# Patient Record
Sex: Male | Born: 1965 | Race: White | Hispanic: No | Smoking: Never smoker
Health system: Southern US, Community
[De-identification: ages and names within clinical notes are randomized; demographics above are authoritative.]

## PROBLEM LIST (undated history)

## (undated) DIAGNOSIS — T7840XA Allergy, unspecified, initial encounter: Secondary | ICD-10-CM

## (undated) DIAGNOSIS — K219 Gastro-esophageal reflux disease without esophagitis: Secondary | ICD-10-CM

## (undated) DIAGNOSIS — K4090 Unilateral inguinal hernia, without obstruction or gangrene, not specified as recurrent: Secondary | ICD-10-CM

## (undated) DIAGNOSIS — K4020 Bilateral inguinal hernia, without obstruction or gangrene, not specified as recurrent: Secondary | ICD-10-CM

## (undated) DIAGNOSIS — M1990 Unspecified osteoarthritis, unspecified site: Secondary | ICD-10-CM

## (undated) DIAGNOSIS — R935 Abnormal findings on diagnostic imaging of other abdominal regions, including retroperitoneum: Secondary | ICD-10-CM

## (undated) DIAGNOSIS — K429 Umbilical hernia without obstruction or gangrene: Secondary | ICD-10-CM

## (undated) DIAGNOSIS — J3089 Other allergic rhinitis: Secondary | ICD-10-CM

## (undated) DIAGNOSIS — Z8616 Personal history of COVID-19: Secondary | ICD-10-CM

## (undated) HISTORY — DX: Umbilical hernia without obstruction or gangrene: K42.9

## (undated) HISTORY — DX: Allergy, unspecified, initial encounter: T78.40XA

## (undated) HISTORY — PX: APPENDECTOMY: SHX54

## (undated) HISTORY — DX: Abnormal findings on diagnostic imaging of other abdominal regions, including retroperitoneum: R93.5

## (undated) HISTORY — DX: Unspecified osteoarthritis, unspecified site: M19.90

## (undated) HISTORY — PX: DENTAL SURGERY: SHX609

## (undated) HISTORY — DX: Unilateral inguinal hernia, without obstruction or gangrene, not specified as recurrent: K40.90

---

## 1985-03-06 HISTORY — PX: APPENDECTOMY: SHX54

## 2015-12-10 ENCOUNTER — Ambulatory Visit (INDEPENDENT_AMBULATORY_CARE_PROVIDER_SITE_OTHER)
Admission: RE | Admit: 2015-12-10 | Discharge: 2015-12-10 | Disposition: A | Discharge status: Home / Self Care | Payer: Self-pay | Source: Ambulatory Visit | Attending: Family Medicine | Admitting: Family Medicine

## 2015-12-10 ENCOUNTER — Encounter: Payer: Self-pay | Admitting: Family Medicine

## 2015-12-10 ENCOUNTER — Encounter: Payer: Self-pay | Admitting: Gastroenterology

## 2015-12-10 ENCOUNTER — Ambulatory Visit (INDEPENDENT_AMBULATORY_CARE_PROVIDER_SITE_OTHER): Payer: Self-pay | Admitting: Family Medicine

## 2015-12-10 VITALS — BP 118/80 | HR 74 | Temp 98.3°F | Resp 12 | Ht 66.0 in | Wt 183.2 lb

## 2015-12-10 DIAGNOSIS — M7541 Impingement syndrome of right shoulder: Secondary | ICD-10-CM

## 2015-12-10 DIAGNOSIS — S4991XA Unspecified injury of right shoulder and upper arm, initial encounter: Secondary | ICD-10-CM

## 2015-12-10 DIAGNOSIS — R200 Anesthesia of skin: Secondary | ICD-10-CM

## 2015-12-10 DIAGNOSIS — R202 Paresthesia of skin: Secondary | ICD-10-CM | POA: Diagnosis not present

## 2015-12-10 DIAGNOSIS — Z1211 Encounter for screening for malignant neoplasm of colon: Secondary | ICD-10-CM | POA: Diagnosis not present

## 2015-12-10 MED ORDER — TRAMADOL HCL 50 MG PO TABS: 50.0000 mg | ORAL_TABLET | Freq: Two times a day (BID) | ORAL | 0 refills | Status: AC | PRN

## 2015-12-10 MED ORDER — MELOXICAM 15 MG PO TABS: 15.0000 mg | ORAL_TABLET | Freq: Every day | ORAL | 0 refills | Status: DC

## 2015-12-10 MED ORDER — METHYLPREDNISOLONE ACETATE 80 MG/ML IJ SUSP
40.0000 mg | Freq: Once | INTRAMUSCULAR | Status: AC
  Administered 2015-12-10: 40 mg via INTRAMUSCULAR

## 2015-12-10 NOTE — Progress Notes (Signed)
HPI:   [name] is a 50 y.o. male, who is here today to establish care with me.  Former PCP: N/A Last preventive routine visit: over 10 years ago. He has never had a colonoscopy, he would like to schedule one.  In general he is healthy except for history of seasonal allergies, takes no chronic medications.  He states that he follows a healthy diet, he does not exercise regularly but he has a physical job. He lives with his girlfriend.  Concerns today: Right shoulder pain. Today's is complaining of 8 days of constant right shoulder pain, points anterior and posterior aspect of the shoulder. Pain started suddenly after he was doing overhead lifting, he felt a "pop" and pain developed right after. Initially throbbing pain now more achy pain, 7/10, exacerbated by laying on left side in bed and with activities that involve lifting and overhead activities. Alleviated by hanging right UE on the edge of bed and with rest. Mild limitation of ROM. He has not noted any joint erythema, erythema, or ecchymosis. He denies any prior history of arthralgia.  He is also having mild numbness, intermittently, and medial aspect of proximal right upper extremity. He denies any cervical pain, rash, or weakness.  Right handed.  He has been taking OTC NSAIDs and Tylenol but they do not seem to help now.     Review of Systems  Constitutional: Negative for appetite change, fatigue, fever and unexpected weight change.  HENT: Negative for nosebleeds, sore throat and trouble swallowing.   Eyes: Negative for pain, redness and visual disturbance.  Respiratory: Negative for apnea, cough, shortness of breath and wheezing.   Cardiovascular: Negative for chest pain, palpitations and leg swelling.  Gastrointestinal: Negative for abdominal pain, blood in stool, nausea and vomiting.  Genitourinary: Negative for decreased urine volume, difficulty urinating and hematuria.  Musculoskeletal: Positive  for arthralgias. Negative for back pain, joint swelling, neck pain and neck stiffness.  Skin: Negative for color change and rash.  Allergic/Immunologic: Positive for environmental allergies.  Neurological: Positive for numbness. Negative for seizures, syncope, weakness and headaches.  Psychiatric/Behavioral: Positive for sleep disturbance (due to pain). Negative for confusion. The patient is not nervous/anxious.       No current outpatient prescriptions on file prior to visit.   No current facility-administered medications on file prior to visit.      Past Medical History:  Diagnosis Date  . Allergy   . Umbilical hernia    Allergies  Allergen Reactions  . Codeine     Family History  Problem Relation Age of Onset  . Heart disease Father   . Stroke Paternal Grandmother   . Cancer Paternal Grandmother     colon    Social History   Social History  . Marital status: Divorced    Spouse name: N/A  . Number of children: N/A  . Years of education: N/A   Social History Main Topics  . Smoking status: Never Smoker  . Smokeless tobacco: Never Used  . Alcohol use No  . Drug use: No  . Sexual activity: Yes   Other Topics Concern  . None   Social History Narrative  . None    Vitals:   12/10/15 0800  BP: 118/80  Pulse: 74  Resp: 12  Temp: 98.3 F (36.8 C)    Body mass index is 29.58 kg/m.   Physical Exam  Nursing note and vitals reviewed. Constitutional: He is oriented to person, place, and time.  He appears well-developed. No distress.  HENT:  Head: Atraumatic.  Mouth/Throat: Oropharynx is clear and moist and mucous membranes are normal.  Eyes: Conjunctivae and EOM are normal. Pupils are equal, round, and reactive to light.  Cardiovascular: Normal rate and regular rhythm.   No murmur heard. Respiratory: Effort normal and breath sounds normal. No respiratory distress.  GI: Soft. He exhibits no mass. There is no hepatomegaly. There is no tenderness.    Musculoskeletal: He exhibits no edema.  Shoulder: No deformity, edema, or erythema appreciated.No muscle atrophy. Luan Pulling' test+, drop arm rotator cuff test+, empty can supraspinatus test +, cross body adduction test +, lift-Off Subscapularis test +. ROM mildly limited (active), passive abduction.  Lymphadenopathy:    He has no cervical adenopathy.  Neurological: He is alert and oriented to person, place, and time. He has normal strength.  Skin: Skin is warm. No erythema.  Psychiatric: He has a normal mood and affect.  Well groomed, good eye contact.      ASSESSMENT AND PLAN:     Peter Archer was seen today for new patient (initial visit).  Diagnoses and all orders for this visit:  Right shoulder injury, initial encounter  ? Sprain, partial tear (rotator cuff,labrum, etc), OA among some. X ray to be done today. Symptomatic treatment recommended. Side effects of Tramadol discussed, he has taken it before and well tolerated. F/U as needed.   -     DG Shoulder Right; Future -     meloxicam (MOBIC) 15 MG tablet; Take 1 tablet (15 mg total) by mouth daily. -     traMADol (ULTRAM) 50 MG tablet; Take 1 tablet (50 mg total) by mouth 2 (two) times daily as needed.  Impingement syndrome, shoulder, right  We discussed treatment options including sub acromial steroid injection. If imaging is negative, PT will be arranged. Avoid overhead repetitive activities for now. ROM exercises for now.  -     meloxicam (MOBIC) 15 MG tablet; Take 1 tablet (15 mg total) by mouth daily. -     traMADol (ULTRAM) 50 MG tablet; Take 1 tablet (50 mg total) by mouth 2 (two) times daily as needed. -     methylPREDNISolone acetate (DEPO-MEDROL) injection 40 mg; Inject 0.5 mLs (40 mg total) into the muscle once.  Right upper extremity numbness  Could be related to trauma, no neurologic abnormalities appreciated today. We discussed some side effects of steroids but may help with numbness and pain, he agrees  with trying. No further studies today but it may be necessary to re-evaluate and consider work up if not resolved in a few weeks or if it gets worse.  -     methylPREDNISolone acetate (DEPO-MEDROL) injection 40 mg; Inject 0.5 mLs (40 mg total) into the muscle once.  Colon cancer screening -     Ambulatory referral to Gastroenterology       -CPE will be arranged.    Peter G. Martinique, MD  [facility]. [place] office.

## 2015-12-10 NOTE — Patient Instructions (Addendum)
A few things to remember from today's visit:   Right shoulder injury, initial encounter - Plan: DG Shoulder Right, meloxicam (MOBIC) 15 MG tablet, traMADol (ULTRAM) 50 MG tablet, methylPREDNISolone acetate (DEPO-MEDROL) injection 40 mg  Impingement syndrome, shoulder, right - Plan: meloxicam (MOBIC) 15 MG tablet, traMADol (ULTRAM) 50 MG tablet  Right upper extremity numbness  Colon cancer screening - Plan: Ambulatory referral to Gastroenterology  Numbness Is related to her local injury, medication given today might help, monitor for worsening symptom.  Today x-ray will be done and if no fracture or major abnormality appointment with physical therapy will be arranged.  Local heat and cold. Avoid repetitive overhead activities for now.    Please be sure medication list is accurate. If a new problem present, please set up appointment for physical and fasting labs.

## 2015-12-20 ENCOUNTER — Ambulatory Visit: Payer: PRIVATE HEALTH INSURANCE | Attending: Family Medicine

## 2015-12-20 DIAGNOSIS — R293 Abnormal posture: Secondary | ICD-10-CM

## 2015-12-20 DIAGNOSIS — M6281 Muscle weakness (generalized): Secondary | ICD-10-CM | POA: Insufficient documentation

## 2015-12-20 DIAGNOSIS — M25511 Pain in right shoulder: Secondary | ICD-10-CM | POA: Diagnosis present

## 2015-12-20 NOTE — Patient Instructions (Addendum)
Posture - Standing   Good posture is important. Avoid slouching and forward head thrust. Maintain curve in low back and align ears over shoulders, hips over ankles.  Pull your belly button in toward your back bone. Posture Tips DO: - stand tall and erect - keep chin tucked in - keep head and shoulders in alignment - check posture regularly in mirror or large window - pull head back against headrest in car seat;  Change your position often.  Sit with lumbar support. DON'T: - slouch or slump while watching TV or reading - sit, stand or lie in one position  for too long;  Sitting is especially hard on the spine so if you sit at a desk/use the computer, then stand up often! Copyright  VHI. All rights reserved.  Posture - Sitting  Sit upright, head facing forward. Try using a roll to support lower back. Keep shoulders relaxed, and avoid rounded back. Keep hips level with knees. Avoid crossing legs for long periods. Copyright  VHI. All rights reserved.  Chronic neck strain can develop because of poor posture and faulty work habits  Postural strain related to slumped sitting and forward head posture is a leading cause of headaches, neck and upper back pain  General strengthening and flexibility exercises are helpful in the treatment of neck pain.  Most importantly, you should learn to correct the posture that may be contributing to chronic pain.   Change positions frequently  Change your work or home environment to improve posture and mechanics.   KEEP HEAD IN NEUTRAL AND SHOULDERS DOWN AND RELAXED   Hold tubing in right hand, arm forward. Pull arm back, elbow straight. Repeat __10__ times per set. Do __2__ sets per session. Do _1-2___ sessions per day.  Copyright  VHI. All rights reserved.     With resistive band anchored in door, grasp both ends. Keeping elbows bent, pull back, squeezing shoulder blades together. Hold _3__ seconds. Repeat _2x10___ times. Do _1-2___ sessions per  day.  [email or web address].exer.[email or web address]   [facility] [address] [place], [place] [address] Phone # [phone] Fax [phone]

## 2015-12-20 NOTE — Therapy (Signed)
[facility] [address] [place], [place], [address] Phone: [phone]   Fax:  [phone]  Physical Therapy Evaluation  Patient Details  Name: Peter Archer MRN: [id number] Date of Birth: 06-08-1965 Referring Provider: Martinique, Betty, MD  Encounter Date: 12/20/2015      PT End of Session - 12/20/15 0918    Visit Number 1   Date for PT Re-Evaluation 02/14/16   PT Start Time 0852   PT Stop Time 0925   PT Time Calculation (min) 33 min   Activity Tolerance Patient tolerated treatment well   Behavior During Therapy [facility] for tasks assessed/performed      Past Medical History:  Diagnosis Date  . Allergy   . Umbilical hernia     Past Surgical History:  Procedure Laterality Date  . APPENDECTOMY  1987    There were no vitals filed for this visit.       Subjective Assessment - 12/20/15 0857    Subjective Pt is a Rt hand dominant male who presents to PT with complaints of Rt shoulder pain that began 12/02/15 that began without cause.  Pt tried to raise the Rt arm and felt a pop followed by pain.     Diagnostic tests x-ray: negative   Patient Stated Goals reduce Rt shoulder pain, reach over head with Rt shoulder   Currently in Pain? Yes   Pain Score 7    Pain Location Shoulder   Pain Orientation Right   Pain Descriptors / Indicators Throbbing;Sore   Pain Type Acute pain   Pain Onset 1 to 4 weeks ago   Pain Frequency Intermittent   Aggravating Factors  raising arm overhead, use of Rt UE with work, in the evening after working, sleep   Pain Relieving Factors medicine, rest of Rt arm on pillow,    Effect of Pain on Daily Activities pain with work, pain with sleep            [facility] PT Assessment - 12/20/15 0001      Assessment   Medical Diagnosis Impingement syndrome, shoulder, right   Referring Provider Martinique, Betty, MD   Onset Date/Surgical Date 12/02/15   Hand Dominance Right   Next MD Visit  none   Prior Therapy none     Precautions   Precautions None     Restrictions   Weight Bearing Restrictions No     Balance Screen   Has the patient fallen in the past 6 months No   Has the patient had a decrease in activity level because of a fear of falling?  No   Is the patient reluctant to leave their home because of a fear of falling?  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  Pain on the  Rt with all AROM     PROM   Overall PROM  Within functional limits for tasks performed     Strength   Overall Strength Deficits   Overall Strength Comments Rt shoulder strength 4/5 throughout with pain upon testing.  Lt shoulder 5/5.     Palpation   Palpation comment pt with palplable tenderness over Rt medial scapular border and Rt rotator cuff                           PT Education - 12/20/15 0917    Education provided Yes   Education Details posture, scapular theraband attached   Person(s) Educated Patient   Methods Explanation;Demonstration;Handout   Comprehension Verbalized understanding;Returned demonstration          PT Short Term Goals - 12/20/15 0901      PT SHORT TERM GOAL #1   Title be independent in initial HEP   Time 4   Period Weeks   Status New     PT SHORT TERM GOAL #2   Title sleep with 25% less  Rt UE pain to improve quality and quantity of sleep   Time 4   Period Weeks   Status New     PT SHORT TERM GOAL #3   Title report a 30% reduction in Rt shoulder pain with work activity   Time 4   Period Weeks   Status New           PT Long Term Goals - 12/20/15 0854      PT LONG TERM GOAL #1   Title be inependent in advanced HEP   Time 8   Period Weeks   Status New     PT LONG TERM GOAL #2   Title reduce FOTO to < or = to 30% limitation   Time 8   Period Weeks   Status New     PT LONG TERM GOAL #3   Title sleep with 50% less Rt shoulder pain to improve quality and quantity of sleep   Time 8   Period Weeks   Status New     PT LONG TERM GOAL #4   Title report a 60% reduction in Rt shoulder pain with use at work   Time 8   Period Weeks   Status New     PT LONG TERM GOAL #5   Title verbalize and demonstrate neutral seated posture at least 50% of the time   Time 8   Period Weeks   Status New               Plan - 12/20/15 [id number]    Clinical Impression Statement Pt is a Rt hand dominant male who presents to PT with Rt shoulder pain that began 12/02/15 without injury.  Pt reached up and felt a pop followed by pain.  Pt had x-ray that was negative.  Pt demonstrates poor posture, painful Rt shoulder AROM, reduced Rt shoulder strength with resisted testing due to pain, and palpable tenderness at the Rt medial scapular border and rotator cuff.  Pt will benefit from skilled PT for posutral and shoulder strength progression, manual and modalities to reduce pain.     Rehab Potential Good   PT Frequency 2x / week   PT Duration 8 weeks   PT Treatment/Interventions ADLs/Self Care Home Management;Cryotherapy;Electrical Stimulation;Iontophoresis 4mg /ml Dexamethasone;Ultrasound;Moist Heat;Therapeutic activities;Therapeutic exercise;Neuromuscular re-education;Patient/family education;Passive range of motion;Manual techniques;Dry needling;Taping   PT Next Visit Plan ionto  if  order signed, Rt shoulder strength, AROM, postural strength   Consulted and Agree with Plan of Care Patient      Patient will benefit from skilled therapeutic intervention in order to improve the following deficits and impairments:  Pain, Postural dysfunction, Decreased strength, Improper body mechanics, Decreased activity tolerance, Impaired UE functional use  Visit Diagnosis: Acute pain of right shoulder - Plan: PT plan of care cert/re-cert  Abnormal posture - Plan: PT plan of care cert/re-cert  Muscle weakness (generalized) - Plan: PT plan of care cert/re-cert     Problem List There are no active problems to display for this patient.    [name], PT 12/20/15 9:29 AM  [facility] [address] [place], [place], [address] Phone: [phone]   Fax:  [phone]  Name: Aristeo Granade MRN: [id number] Date of Birth: 04-13-1965

## 2015-12-23 ENCOUNTER — Ambulatory Visit: Payer: PRIVATE HEALTH INSURANCE

## 2015-12-23 DIAGNOSIS — M25511 Pain in right shoulder: Secondary | ICD-10-CM | POA: Diagnosis not present

## 2015-12-23 DIAGNOSIS — M6281 Muscle weakness (generalized): Secondary | ICD-10-CM

## 2015-12-23 DIAGNOSIS — R293 Abnormal posture: Secondary | ICD-10-CM

## 2015-12-23 NOTE — Therapy (Signed)
[facility] [address], [place], [place], [address] Phone: [phone]   Fax:  [phone]  Physical Therapy Treatment  Patient Details  Name: Peter Archer MRN: [id number] Date of Birth: 1965/07/25 Referring Provider: Martinique, Betty, MD  Encounter Date: 12/23/2015      PT End of Session - 12/23/15 0806    Visit Number 2   Date for PT Re-Evaluation 02/14/16   PT Start Time 0740  10 min late   PT Stop Time 0806   PT Time Calculation (min) 26 min   Activity Tolerance Patient tolerated treatment well   Behavior During Therapy [facility] for tasks assessed/performed      Past Medical History:  Diagnosis Date  . Allergy   . Umbilical hernia     Past Surgical History:  Procedure Laterality Date  . APPENDECTOMY  1987    There were no vitals filed for this visit.      Subjective Assessment - 12/23/15 0741    Subjective Back of the shoulder is feeling better.  Had pain in the front of the Rt shoulder 2 nights ago that disrupted sleep.     Patient Stated Goals reduce Rt shoulder pain, reach over head with Rt shoulder   Currently in Pain? Yes   Pain Score 5    Pain Location Shoulder   Pain Orientation Right   Pain Descriptors / Indicators Throbbing;Sore   Pain Type Acute pain   Pain Onset 1 to 4 weeks ago   Pain Frequency Intermittent   Aggravating Factors  Raising arm overhead, use of Rt UE with work, in the eveneing after working, sleep   Pain Relieving Factors medicine, Rt arm on pillow                         [facility] Adult PT Treatment/Exercise - 12/23/15 0001      Exercises   Exercises Shoulder     Shoulder Exercises: Supine   Horizontal ABduction Strengthening;Both;20 reps;Theraband   Theraband Level (Shoulder Horizontal ABduction) Level 2 (Red)   External Rotation Strengthening;Both;20 reps;Theraband   Theraband Level (Shoulder External Rotation) Level 2 (Red)   Other Supine  Exercises D2 red band 2x10      Shoulder Exercises: Standing   Extension Strengthening;Both;20 reps;Theraband   Theraband Level (Shoulder Extension) Level 2 (Red)   Row Strengthening;Both;20 reps   Theraband Level (Shoulder Row) Level 2 (Red)     Shoulder Exercises: ROM/Strengthening   UBE (Upper Arm Bike) Level 1x 6 minutes (3/3)  verbal cues for posture, PT present to discuss     Modalities   Modalities Iontophoresis     Iontophoresis   Type of Iontophoresis Dexamethasone   Location Rt anterior shoulder   Dose 1.0cc  #1   Time 6 hour wear                PT Education - 12/23/15 0745    Education provided Yes   Education Details pec stretch, scapular theraband unattached in supine, ionto info   Person(s) Educated Patient   Methods Explanation;Demonstration   Comprehension Verbalized understanding;Returned demonstration          PT Short Term Goals - 12/20/15 0901      PT SHORT TERM GOAL #1   Title be independent in initial HEP   Time 4   Period Weeks   Status New     PT SHORT TERM GOAL #2   Title sleep with 25%  less Rt UE pain to improve quality and quantity of sleep   Time 4   Period Weeks   Status New     PT SHORT TERM GOAL #3   Title report a 30% reduction in Rt shoulder pain with work activity   Time 4   Period Weeks   Status New           PT Long Term Goals - 12/20/15 0854      PT LONG TERM GOAL #1   Title be inependent in advanced HEP   Time 8   Period Weeks   Status New     PT LONG TERM GOAL #2   Title reduce FOTO to < or = to 30% limitation   Time 8   Period Weeks   Status New     PT LONG TERM GOAL #3   Title sleep with 50% less Rt shoulder pain to improve quality and quantity of sleep   Time 8   Period Weeks   Status New     PT LONG TERM GOAL #4   Title report a 60% reduction in Rt shoulder pain with use at work   Time 8   Period Weeks   Status New     PT LONG TERM GOAL #5   Title verbalize and demonstrate neutral  seated posture at least 50% of the time   Time 8   Period Weeks   Status New               Plan - 12/23/15 0754    Clinical Impression Statement Pt has been making postural corrections at home and reports that he is more aware of his alignment.  PT is focusing on postural/scapular strength to reduce shoulder impingement.  Pain in Rt shoulder is reduced to 5/10 today.  Pt will continue to benefit from skilled PT for postural strength, manual/modalities and flexibility to reduce pain.     Rehab Potential Good   PT Frequency 2x / week   PT Duration 8 weeks   PT Treatment/Interventions ADLs/Self Care Home Management;Cryotherapy;Electrical Stimulation;Iontophoresis 4mg /ml Dexamethasone;Ultrasound;Moist Heat;Therapeutic activities;Therapeutic exercise;Neuromuscular re-education;Patient/family education;Passive range of motion;Manual techniques;Dry needling;Taping   PT Next Visit Plan ionto if order signed, Rt shoulder strength, AROM, postural strength.  Ionto #2      Patient will benefit from skilled therapeutic intervention in order to improve the following deficits and impairments:  Pain, Postural dysfunction, Decreased strength, Improper body mechanics, Decreased activity tolerance, Impaired UE functional use  Visit Diagnosis: Acute pain of right shoulder  Abnormal posture  Muscle weakness (generalized)     Problem List There are no active problems to display for this patient.  [name], PT 12/23/15 8:07 AM  [facility] [address] [place], [place], [address] Phone: [phone]   Fax:  [phone]  Name: Peter Archer MRN: [id number] Date of Birth: 12-03-1965

## 2015-12-23 NOTE — Patient Instructions (Addendum)
Flexibility: Corner Stretch    Standing in corner with hands just above shoulder level and feet ____ inches from corner, lean forward until a comfortable stretch is felt across chest. Hold _20___ seconds. Repeat _3___ times per set. Do _1___ sets per session. Do _3___ sessions per day.  http://orth.exer.us/342   Copyright  VHI. All rights reserved.    Do exercises below 10 times, 1-2 times a day.        Side Pull: Double Arm   On back, knees bent, feet flat. Arms perpendicular to body, shoulder level, elbows straight but relaxed. Pull arms out to sides, elbows straight. Resistance band comes across collarbones, hands toward floor. Hold momentarily. Slowly return to starting position. Repeat ___ times. Band color _____   Sash   On back, knees bent, feet flat, left hand on left hip, right hand above left. Pull right arm DIAGONALLY (hip to shoulder) across chest. Bring right arm along head toward floor. Hold momentarily. Slowly return to starting position. Repeat ___ times. Do with left arm. Band color ______   Shoulder Rotation: Double Arm   On back, knees bent, feet flat, elbows tucked at sides, bent 90, hands palms up. Pull hands apart and down toward floor, keeping elbows near sides. Hold momentarily. Slowly return to starting position. Repeat ___ times. Band color ______   IONTOPHORESIS PATIENT PRECAUTIONS & CONTRAINDICATIONS:  . Redness under one or both electrodes can occur.  This characterized by a uniform redness that usually disappears within 12 hours of treatment. . Small pinhead size blisters may result in response to the drug.  Contact your physician if the problem persists more than 24 hours. . On rare occasions, iontophoresis therapy can result in temporary skin reactions such as rash, inflammation, irritation or burns.  The skin reactions may be the result of individual sensitivity to the ionic solution used, the condition of the skin at the start of treatment,  reaction to the materials in the electrodes, allergies or sensitivity to dexamethasone, or a poor connection between the patch and your skin.  Discontinue using iontophoresis if you have any of these reactions and report to your therapist. . Remove the Patch or electrodes if you have any undue sensation of pain or burning during the treatment and report discomfort to your therapist. . Tell your Therapist if you have had known adverse reactions to the application of electrical current. . If using the Patch, the LED light will turn off when treatment is complete and the patch can be removed.  Approximate treatment time is 1-3 hours.  Remove the patch when light goes off or after 6 hours. . The Patch can be worn during normal activity, however excessive motion where the electrodes have been placed can cause poor contact between the skin and the electrode or uneven electrical current resulting in greater risk of skin irritation. Marland Kitchen Keep out of the reach of children.   . DO NOT use if you have a cardiac pacemaker or any other electrically sensitive implanted device. . DO NOT use if you have a known sensitivity to dexamethasone. . DO NOT use during Magnetic Resonance Imaging (MRI). . DO NOT use over broken or compromised skin (e.g. sunburn, cuts, or acne) due to the increased risk of skin reaction. . DO NOT SHAVE over the area to be treated:  To establish good contact between the Patch and the skin, excessive hair may be clipped. . DO NOT place the Patch or electrodes on or over your eyes, directly over  your heart, or brain. . DO NOT reuse the Patch or electrodes as this may cause burns to occur.   [facility] [address] [place], [place] [address] Phone # [phone] Fax [phone]

## 2015-12-27 ENCOUNTER — Ambulatory Visit: Payer: PRIVATE HEALTH INSURANCE

## 2015-12-27 DIAGNOSIS — M25511 Pain in right shoulder: Secondary | ICD-10-CM | POA: Diagnosis not present

## 2015-12-27 DIAGNOSIS — M6281 Muscle weakness (generalized): Secondary | ICD-10-CM

## 2015-12-27 DIAGNOSIS — R293 Abnormal posture: Secondary | ICD-10-CM

## 2015-12-27 NOTE — Therapy (Addendum)
[facility] [address] [place], [place], [address] Phone: [phone]   Fax:  [phone]  Physical Therapy Treatment  Patient Details  Name: Peter Archer MRN: [id number] Date of Birth: 1965/11/19 Referring Provider: Martinique, Betty, MD  Encounter Date: 12/27/2015      PT End of Session - 12/27/15 0924    Visit Number 3   Date for PT Re-Evaluation 02/14/16   PT Start Time 0845   PT Stop Time 0925   PT Time Calculation (min) 40 min   Activity Tolerance Patient tolerated treatment well   Behavior During Therapy [facility] for tasks assessed/performed      Past Medical History:  Diagnosis Date  . Allergy   . Umbilical hernia     Past Surgical History:  Procedure Laterality Date  . APPENDECTOMY  1987    There were no vitals filed for this visit.      Subjective Assessment - 12/27/15 0904    Subjective Feeling better since the start of care.  50% overall improvement.     Patient Stated Goals reduce Rt shoulder pain, reach over head with Rt shoulder   Currently in Pain? Yes   Pain Score 5    Pain Location Shoulder   Pain Orientation Right   Pain Descriptors / Indicators Throbbing;Sore   Pain Type Acute pain   Pain Onset 1 to 4 weeks ago   Pain Frequency Intermittent   Aggravating Factors  raising arm overhear, using Rt UE with work, sleep   Pain Relieving Factors medicine, Rt arm on pillow                         [facility] Adult PT Treatment/Exercise - 12/27/15 0001      Shoulder Exercises: Supine   Horizontal ABduction Strengthening;Both;20 reps;Theraband   Theraband Level (Shoulder Horizontal ABduction) Level 2 (Red)  on foam roll   External Rotation Strengthening;Both;20 reps;Theraband   Theraband Level (Shoulder External Rotation) Level 2 (Red)  on foam roll   Other Supine Exercises overhead flexion with bil arms 2x10  on foam roll     Shoulder Exercises: Standing   Other  Standing Exercises snow angels on wall (partial range) x20     Shoulder Exercises: ROM/Strengthening   UBE (Upper Arm Bike) Level 1x 6 minutes (3/3)  verbal cues for posture, PT present to discuss     Shoulder Exercises: Stretch   Corner Stretch 3 reps;20 seconds   Other Shoulder Stretches wall ladder: Rt shoulder flexion 2x10     Iontophoresis   Type of Iontophoresis Dexamethasone   Location Rt anterior shoulder   Dose 1.0cc  #2   Time 6 hour wear                  PT Short Term Goals - 12/27/15 0906      PT SHORT TERM GOAL #1   Title be independent in initial HEP   Status Achieved     PT SHORT TERM GOAL #2   Title sleep with 25% less Rt UE pain to improve quality and quantity of sleep   Status Achieved     PT SHORT TERM GOAL #3   Title report a 30% reduction in Rt shoulder pain with work activity   Status Achieved           PT Long Term Goals - 12/20/15 0854      PT LONG TERM GOAL #1   Title  be inependent in advanced HEP   Time 8   Period Weeks   Status New     PT LONG TERM GOAL #2   Title reduce FOTO to < or = to 30% limitation   Time 8   Period Weeks   Status New     PT LONG TERM GOAL #3   Title sleep with 50% less Rt shoulder pain to improve quality and quantity of sleep   Time 8   Period Weeks   Status New     PT LONG TERM GOAL #4   Title report a 60% reduction in Rt shoulder pain with use at work   Time 8   Period Weeks   Status New     PT LONG TERM GOAL #5   Title verbalize and demonstrate neutral seated posture at least 50% of the time   Time 8   Period Weeks   Status New               Plan - 12/27/15 0906    Clinical Impression Statement Pt reports 50% overall improvement since the start of care.  Pt reports that he is able to sleep without much interruption.  PT is focusing on postural/scapular strength to reduce shoulder impingement.  Pain remains 5/10 in the Rt shoulder but quality of movement is improved.  Pt will  continue to benefit from skilled PT for postural strength, manual/modalites and flexibility to reduce pain.     Rehab Potential Good   PT Frequency 2x / week   PT Duration 8 weeks   PT Treatment/Interventions ADLs/Self Care Home Management;Cryotherapy;Electrical Stimulation;Iontophoresis 31m/ml Dexamethasone;Ultrasound;Moist Heat;Therapeutic activities;Therapeutic exercise;Neuromuscular re-education;Patient/family education;Passive range of motion;Manual techniques;Dry needling;Taping   PT Next Visit Plan , Rt shoulder strength, AROM, postural strength.  Ionto #3   Consulted and Agree with Plan of Care Patient      Patient will benefit from skilled therapeutic intervention in order to improve the following deficits and impairments:  Pain, Postural dysfunction, Decreased strength, Improper body mechanics, Decreased activity tolerance, Impaired UE functional use  Visit Diagnosis: Acute pain of right shoulder  Abnormal posture  Muscle weakness (generalized)     Problem List There are no active problems to display for this patient.   Peter Archer 12/27/2015, 9:26 AM PHYSICAL THERAPY DISCHARGE SUMMARY  Visits from Start of Care: 3  Current functional level related to goals / functional outcomes: Pt didn't return to PT.  He asked to be placed on hold after last session on 12/27/15.     Remaining deficits: See above for most recent status.     Education / Equipment: HEP Plan: Patient agrees to discharge.  Patient goals were partially met. Patient is being discharged due to the patient's request.  ?????        KSigurd Archer PT 02/03/16 9:18 AM   [facility] [address] [place] [id number]: [phone]  Fax:  [phone] Name: Peter KlaiberMRN: [id number] of Birth: [birth year]

## 2015-12-30 ENCOUNTER — Encounter: Payer: PRIVATE HEALTH INSURANCE | Admitting: Physical Therapy

## 2015-12-30 ENCOUNTER — Other Ambulatory Visit: Payer: Self-pay

## 2015-12-30 DIAGNOSIS — S4991XA Unspecified injury of right shoulder and upper arm, initial encounter: Secondary | ICD-10-CM

## 2015-12-30 DIAGNOSIS — M7541 Impingement syndrome of right shoulder: Secondary | ICD-10-CM

## 2015-12-30 MED ORDER — MELOXICAM 15 MG PO TABS: 15.0000 mg | ORAL_TABLET | Freq: Every day | ORAL | 5 refills | Status: AC

## 2016-01-03 ENCOUNTER — Ambulatory Visit: Payer: PRIVATE HEALTH INSURANCE | Admitting: Physical Therapy

## 2016-01-06 ENCOUNTER — Encounter: Payer: PRIVATE HEALTH INSURANCE | Admitting: Physical Therapy

## 2016-01-13 ENCOUNTER — Telehealth: Payer: Self-pay | Admitting: Family Medicine

## 2016-01-13 NOTE — Telephone Encounter (Signed)
Noted  

## 2016-01-13 NOTE — Telephone Encounter (Signed)
Pt need new Rx for Tramadol   Pharm:  [facility]

## 2016-01-13 NOTE — Telephone Encounter (Signed)
Okay for refill?  Patient does have an appt set for January as asked for last visit.

## 2016-01-13 NOTE — Telephone Encounter (Signed)
Left voicemail letting patient know the information below. Told him to let us know if he wants the ortho referral.

## 2016-01-13 NOTE — Telephone Encounter (Signed)
Tramadol was prescribed for acute pain and recommended for short period of time. If he is still having shoulder pain, ortho referral can be arranged. Rx is not authorized at this time.  Thanks, BJ

## 2016-01-13 NOTE — Telephone Encounter (Signed)
Pt does not want to be referred to ortho at this time

## 2016-01-17 ENCOUNTER — Telehealth: Payer: Self-pay | Admitting: Family Medicine

## 2016-01-17 ENCOUNTER — Ambulatory Visit (INDEPENDENT_AMBULATORY_CARE_PROVIDER_SITE_OTHER): Payer: PRIVATE HEALTH INSURANCE | Admitting: Family Medicine

## 2016-01-17 ENCOUNTER — Encounter: Payer: Self-pay | Admitting: Family Medicine

## 2016-01-17 VITALS — BP 110/80 | HR 77 | Resp 12 | Ht 66.0 in | Wt 185.5 lb

## 2016-01-17 DIAGNOSIS — M5442 Lumbago with sciatica, left side: Secondary | ICD-10-CM | POA: Diagnosis not present

## 2016-01-17 MED ORDER — METHYLPREDNISOLONE ACETATE 80 MG/ML IJ SUSP
40.0000 mg | Freq: Once | INTRAMUSCULAR | Status: AC
  Administered 2016-01-17: 40 mg via INTRAMUSCULAR

## 2016-01-17 MED ORDER — PREDNISONE 20 MG PO TABS: ORAL_TABLET | ORAL | 0 refills | Status: AC

## 2016-01-17 MED ORDER — CYCLOBENZAPRINE HCL 10 MG PO TABS: 10.0000 mg | ORAL_TABLET | Freq: Three times a day (TID) | ORAL | 0 refills | Status: AC | PRN

## 2016-01-17 NOTE — Telephone Encounter (Signed)
Noted  

## 2016-01-17 NOTE — Telephone Encounter (Signed)
Patient Name: Peter Archer  DOB: Jan 18, 1966    Initial Comment Caller states he pulled a muscle in his back.    Nurse Assessment  Nurse: Verlin Fester RN, Stanton Kidney Date/Time Eilene Ghazi Time): 01/17/2016 9:52:38 AM  Confirm and document reason for call. If symptomatic, describe symptoms. You must click the next button to save text entered. ---Patient states he is having low back pain  Has the patient traveled out of the country within the last 30 days? ---Not Applicable  Does the patient have any new or worsening symptoms? ---Yes  Will a triage be completed? ---Yes  Related visit to physician within the last 2 weeks? ---No  Does the PT have any chronic conditions? (i.e. diabetes, asthma, etc.) ---No  Is this a behavioral health or substance abuse call? ---No     Guidelines    Guideline Title Affirmed Question Affirmed Notes  Back Pain [1] SEVERE back pain (e.g., excruciating, unable to do any normal activities) AND [2] not improved 2 hours after pain medicine    Final Disposition User   See Physician within 4 Hours (or PCP triage) Verlin Fester, RN, [facility]    Referrals  REFERRED TO PCP OFFICE   Disagree/Comply: Comply

## 2016-01-17 NOTE — Telephone Encounter (Signed)
Patient scheduled for 2:30 with Dr. Martinique

## 2016-01-17 NOTE — Progress Notes (Signed)
Pre visit review using our clinic review tool, if applicable. No additional management support is needed unless otherwise documented below in the visit note. 

## 2016-01-17 NOTE — Progress Notes (Signed)
HPI:  ACUTE VISIT:  Chief Complaint  Patient presents with  . Back Pain    Peter Archer is a 50 y.o. male, who is here today complaining of back pain that started on 01/15/16.   He states that initially he noted a "twich" on right lower back and cervical area while he was lifting a ladder on 01/15/16 morning, he continued working and eventually pain resolved, then latter same day pain re-occurred.  Next day he had a hard time getting out of bed,pain was severe, better with movement.  Cervical pain has resolved.  Lower back pain is sometimes radiated to LLE.   Pain is constant, achy like, max "12/10" in intensity, with no associated LE numbness, tingling, urinary incontinence or retention, stool incontinence, or saddle anesthesia.  Exacerbated by prolonged standing,sititng, and lying down. Limitation of activities.  Alleviated by rest and position changes. No rash or edema on area, fever, chills, or abnormal wt loss.  Prior Hx of back pain: Denies    OTC medications: Ibuprofen 800 mg , last time 10 am today  Self-employed.    Review of Systems  Constitutional: Negative for appetite change, chills, fatigue and fever.  Gastrointestinal: Negative for abdominal pain, nausea and vomiting.       No changes in bowel habits or stool incontinence.  Genitourinary: Negative for decreased urine volume, difficulty urinating, dysuria, hematuria and urgency.       Negative for urine incontinence  Musculoskeletal: Positive for back pain. Negative for neck pain.  Skin: Negative for color change and rash.  Neurological: Negative for syncope, weakness, numbness and headaches.  Psychiatric/Behavioral: Negative for confusion. The patient is not nervous/anxious.       Current Outpatient Prescriptions on File Prior to Visit  Medication Sig Dispense Refill  . meloxicam (MOBIC) 15 MG tablet Take 1 tablet (15 mg total) by mouth daily. 30 tablet 5   No current  facility-administered medications on file prior to visit.      Past Medical History:  Diagnosis Date  . Allergy   . Umbilical hernia    Allergies  Allergen Reactions  . Codeine     Social History   Social History  . Marital status: Divorced    Spouse name: N/A  . Number of children: N/A  . Years of education: N/A   Social History Main Topics  . Smoking status: Never Smoker  . Smokeless tobacco: Never Used  . Alcohol use No  . Drug use: No  . Sexual activity: Yes   Other Topics Concern  . None   Social History Narrative  . None    Vitals:   01/17/16 1416  BP: 110/80  Pulse: 77  Resp: 12   Body mass index is 29.94 kg/m.    Physical Exam  Constitutional: He is oriented to person, place, and time. He appears well-developed. He does not appear ill. No distress.  HENT:  Head: Atraumatic.  Eyes: Conjunctivae are normal.  Cardiovascular:  Pulses:      Dorsalis pedis pulses are 2+ on the right side, and 2+ on the left side.  Respiratory: Effort normal and breath sounds normal. No respiratory distress.  Musculoskeletal: He exhibits no edema.  No significant deformity appreciated. + Mild tenderness upon palpation of paraspinal muscles, left L2-L5. Muscle spasm. Pain elicited with movement on exam table during examination. Hip flexion otherwise normal, left one elicits lower back pain. Mildly antalgic gait.   Neurological: He is alert and oriented to person,  place, and time. He has normal strength. Coordination normal.  Reflex Scores:      Patellar reflexes are 2+ on the right side and 2+ on the left side. SLR negative bilateral.  Skin: Skin is warm. No erythema.  Psychiatric: He has a normal mood and affect.  Well groomed, good eye contact.      ASSESSMENT AND PLAN:     Peter Archer was seen today for back pain.  Diagnoses and all orders for this visit:  Acute back pain with sciatica, left -     predniSONE (DELTASONE) 20 MG tablet; 3 tabs for 3  days, 2 tabs for 3 days, 1 tabs for 3 days, and 1/2 tab for 3 days. Take tables together with breakfast. -     cyclobenzaprine (FLEXERIL) 10 MG tablet; Take 1 tablet (10 mg total) by mouth 3 (three) times daily as needed for muscle spasms. -     methylPREDNISolone acetate (DEPO-MEDROL) injection 40 mg; Inject 0.5 mLs (40 mg total) into the muscle once.   Because no Hx of direct trauma + provided history and examination I don't think imaging is needed today. He can continue OTC NSAIDs (or Mobic), local heat, and relative rest. We discussed current recommendations in regard to steroid use and prognosis, still I am recommending Prednisone taper. Some side effects of medications prescribed today were discussed. After verbal consent she received Depo-Medrol 40 mg IM x 1. Instructed about warning signs. Follow-up as needed.     Return if symptoms worsen or fail to improve.     -Peter Archer was advised to return or notify a doctor immediately if symptoms worsen or persist or new concerns arise, he voices understanding.       Peter Fitzner G. Martinique, MD  [facility]. [place] office.

## 2016-01-17 NOTE — Patient Instructions (Signed)
A few things to remember from today's visit:   Acute back pain with sciatica, left - Plan: predniSONE (DELTASONE) 20 MG tablet, cyclobenzaprine (FLEXERIL) 10 MG tablet   If not resolve in 6 weeks MRI may be necessary. Start Prednisone tomorrow with breakfast.   Back pain is very common in adults.The cause of back pain is rarely dangerous and the pain often gets better over time even with no pharmacologic treatment.  The cause of your back pain may not be known. Some common causes of back pain include: 1. Strain of the muscles or ligaments supporting the spine. 2. Wear and tear (degeneration) of the spinal disks. 3. Arthritis. 4. Direct injury to the back. 5.  For many people, back pain may return. Since back pain is rarely dangerous, most people can learn to manage this condition on their own.  HOME CARE INSTRUCTIONS Watch your back pain for any changes. The following actions may help to lessen any discomfort you are feeling: 1. Remain active. It is stressful on your back to sit or stand in one place for long periods of time. Do not sit, drive, or stand in one place for more than 30 minutes at a time. Take short walks on even surfaces as soon as you are able.Try to increase the length of time you walk each day. 2.  3. Exercise regularly as directed by your health care provider. Exercise helps your back heal faster. It also helps avoid future injury by keeping your muscles strong and flexible.  4. Do not stay in bed.Resting more than 1-2 days can delay your recovery.                                                     5. Pay attention to your body when you bend and lift. The most comfortable positions are those that put less stress on your recovering back.  6.  Always use proper lifting techniques, including: 1. Bending your knees. 2. Keeping the load close to your body. 3. Avoiding twisting.  7. Find a comfortable position to sleep. Use a firm mattress and lie on your side with  your knees slightly bent. If you lie on your back, put a pillow under your knees.  8. Over the counter rubbing medications like Icy Hot or local heat might help.  Acetaminophen and/or Aleve/Ibuprofen can be taken if needed and if not contraindications. Local ice and heat may be alternated to reduce pain and spasms.     Muscle relaxants might or might not help, they cause drowsiness among other    side effects. They could also interact with some of medications you may be already taking (medications for depression/anxiety and some pain medications).   9. Maintain a healthy weight. Excess weight puts extra stress on your back and makes it difficult to maintain good posture.   SEEK MEDICAL CARE IF: worsening pain, associated fever, rash/edema on area, pain going to legs or buttocks, numbness/tingling, night pain, or abnormal weight loss.    SEEK IMMEDIATE MEDICAL CARE IF:  1. You develop new bowel or bladder control problems. 2. You have unusual weakness or numbness in your arms or legs. 3. You develop nausea or vomiting. 4. You develop abdominal pain. 5. You feel faint.     Back Exercises The following exercises strengthen the muscles that help to  support the back. They also help to keep the lower back flexible. Doing these exercises can help to prevent back pain or lessen existing pain. If you have back pain or discomfort, try doing these exercises 2-3 times each day or as told by your health care provider. When the pain goes away, do them once each day, but increase the number of times that you repeat the steps for each exercise (do more repetitions). If you do not have back pain or discomfort, do these exercises once each day or as told by your health care provider.   EXERCISES Single Knee to Chest Repeat these steps 3-5 times for each leg: 6. Lie on your back on a firm bed or the floor with your legs extended. 7. Bring one knee to your chest. Your other leg should stay extended and in  contact with the floor. 8. Hold your knee in place by grabbing your knee or thigh. 9. Pull on your knee until you feel a gentle stretch in your lower back. 10. Hold the stretch for 10-30 seconds. 11. Slowly release and straighten your leg.  Pelvic Tilt Repeat these steps 5-10 times: 10. Lie on your back on a firm bed or the floor with your legs extended. [place] your knees so they are pointing toward the ceiling and your feet are flat on the floor. 12. Tighten your lower abdominal muscles to press your lower back against the floor. This motion will tilt your pelvis so your tailbone points up toward the ceiling instead of pointing to your feet or the floor. 13. With gentle tension and even breathing, hold this position for 5-10 seconds.  Cat-Cow Repeat these steps until your lower back becomes more flexible: 1. Get into a hands-and-knees position on a firm surface. Keep your hands under your shoulders, and keep your knees under your hips. You may place padding under your knees for comfort. 2. Let your head hang down, and point your tailbone toward the floor so your lower back becomes rounded like the back of a cat. 3. Hold this position for 5 seconds. 4. Slowly lift your head and point your tailbone up toward the ceiling so your back forms a sagging arch like the back of a cow. 5. Hold this position for 5 seconds.   Press-Ups Repeat these steps 5-10 times: 6. Lie on your abdomen (face-down) on the floor. 7. Place your palms near your head, about shoulder-width apart. 8. While you keep your back as relaxed as possible and keep your hips on the floor, slowly straighten your arms to raise the top half of your body and lift your shoulders. Do not use your back muscles to raise your upper torso. You may adjust the placement of your hands to make yourself more comfortable. 9. Hold this position for 5 seconds while you keep your back relaxed. 10. Slowly return to lying flat on the  floor.   Bridges Repeat these steps 10 times: 1. Lie on your back on a firm surface. 2. Bend your knees so they are pointing toward the ceiling and your feet are flat on the floor. 3. Tighten your buttocks muscles and lift your buttocks off of the floor until your waist is at almost the same height as your knees. You should feel the muscles working in your buttocks and the back of your thighs. If you do not feel these muscles, slide your feet 1-2 inches farther away from your buttocks. 4. Hold this position for 3-5 seconds. 5.  Slowly lower your hips to the starting position, and allow your buttocks muscles to relax completely. If this exercise is too easy, try doing it with your arms crossed over your chest.

## 2016-02-07 ENCOUNTER — Ambulatory Visit (AMBULATORY_SURGERY_CENTER): Payer: Self-pay | Admitting: *Deleted

## 2016-02-07 ENCOUNTER — Ambulatory Visit (INDEPENDENT_AMBULATORY_CARE_PROVIDER_SITE_OTHER): Payer: PRIVATE HEALTH INSURANCE | Admitting: Family Medicine

## 2016-02-07 ENCOUNTER — Encounter: Payer: Self-pay | Admitting: Family Medicine

## 2016-02-07 VITALS — BP 110/70 | HR 83 | Temp 98.1°F | Resp 12 | Ht 66.0 in | Wt 182.4 lb

## 2016-02-07 VITALS — Ht 66.25 in | Wt 181.0 lb

## 2016-02-07 DIAGNOSIS — R1013 Epigastric pain: Secondary | ICD-10-CM

## 2016-02-07 DIAGNOSIS — R1010 Upper abdominal pain, unspecified: Secondary | ICD-10-CM | POA: Diagnosis not present

## 2016-02-07 DIAGNOSIS — Z1211 Encounter for screening for malignant neoplasm of colon: Secondary | ICD-10-CM

## 2016-02-07 LAB — COMPREHENSIVE METABOLIC PANEL
ALT: 28 U/L (ref 0–53)
AST: 20 U/L (ref 0–37)
Albumin: 4.1 g/dL (ref 3.5–5.2)
Alkaline Phosphatase: 62 U/L (ref 39–117)
BILIRUBIN TOTAL: 1.2 mg/dL (ref 0.2–1.2)
BUN: 13 mg/dL (ref 6–23)
CO2: 27 meq/L (ref 19–32)
Calcium: 8.8 mg/dL (ref 8.4–10.5)
Chloride: 105 mEq/L (ref 96–112)
Creatinine, Ser: 0.91 mg/dL (ref 0.40–1.50)
GFR: 93 mL/min (ref >60.00)
GLUCOSE: 109 mg/dL — AB (ref 70–99)
POTASSIUM: 3.6 meq/L (ref 3.5–5.1)
SODIUM: 140 meq/L (ref 135–145)
Total Protein: 6.6 g/dL (ref 6.0–8.3)

## 2016-02-07 MED ORDER — NA SULFATE-K SULFATE-MG SULF 17.5-3.13-1.6 GM/177ML PO SOLN: 1.0000 | Freq: Once | ORAL | 0 refills | Status: AC

## 2016-02-07 MED ORDER — OMEPRAZOLE 40 MG PO CPDR: 40.0000 mg | DELAYED_RELEASE_CAPSULE | Freq: Every day | ORAL | 1 refills | Status: DC

## 2016-02-07 NOTE — Progress Notes (Signed)
No egg or soy allergy known to patient  No issues with past sedation with any surgeries  or procedures, no intubation problems  No diet pills per patient No home 02 use per patient  No blood thinners per patient  Pt denies issues with constipation  No A fib or A flutter   emmi declined'   

## 2016-02-07 NOTE — Progress Notes (Signed)
Pre visit review using our clinic review tool, if applicable. No additional management support is needed unless otherwise documented below in the visit note. 

## 2016-02-07 NOTE — Progress Notes (Signed)
HPI:  ACUTE VISIT:  Chief Complaint  Patient presents with  . Ingestion    [name] is a 50 y.o. male, who is here today complaining of upper abdominal pain, he points to the epigastrium.   Epigastric pain about 2-3 weeks, intermittently, exacerbated after eating pop corn about a weeks ago. He states that pop corn was "plain" with a "littlr bit" of salt and not sure if it has butter, pain was severe, sharp,radiated to RUQ and low back. Associated nausea, no vomiting, lasted a couple hours and resolved. He continues having epigastric pain.   Today morning after eating breakfast: 2 pieces of toasts , some potatoes chips, and gummies, he felt nauseated and regurgitation of food up to throat with hearburn.  Max pain level 5/10. Excerbated by food intake, feels bloating sensation.   He has taken OTC Ibuprofen, which helped. He also tried Omeprazole his girlfriend gave him and felt better.   He denies any history of alcohol abuse or tobacco use. He has not noted chills, fever, abnormal weight loss, or urinary symptoms.  Denies vomiting, changes in bowel habits,jaundice,color changes in urine or stool,  blood in stool or melena.  He has had low back before. In general problem has been stable but persistent.    Review of Systems  Constitutional: Negative for appetite change, fatigue, fever and unexpected weight change.  HENT: Negative for mouth sores, nosebleeds, sore throat, trouble swallowing and voice change.   Respiratory: Negative for cough, shortness of breath and wheezing.   Cardiovascular: Negative for chest pain, palpitations and leg swelling.  Gastrointestinal: Positive for abdominal pain and nausea. Negative for blood in stool and vomiting.       No changes in bowel habits.  Genitourinary: Negative for decreased urine volume, dysuria and hematuria.  Musculoskeletal: Positive for back pain. Negative for myalgias.  Skin: Negative for pallor and rash.    Neurological: Negative for weakness and numbness.  Psychiatric/Behavioral: Negative for confusion. The patient is not nervous/anxious.       Current Outpatient Prescriptions on File Prior to Visit  Medication Sig Dispense Refill  . Na Sulfate-K Sulfate-Mg Sulf (SUPREP BOWEL PREP KIT) 17.5-3.13-1.6 GM/180ML SOLN Take 1 kit by mouth once. suprep as directed. No substitutions 354 mL 0   No current facility-administered medications on file prior to visit.      Past Medical History:  Diagnosis Date  . 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 Allergy   . Inguinal hernia    left   . Umbilical hernia    Allergies  Allergen Reactions  . Codeine Anaphylaxis    Social History   Social History  . Marital status: Divorced    Spouse name: N/A  . Number of children: N/A  . Years of education: N/A   Social History Main Topics  . Smoking status: Never Smoker  . Smokeless tobacco: Never Used  . Alcohol use No  . Drug use: No  . Sexual activity: Yes   Other Topics Concern  . None   Social History Narrative  . None    Vitals:   02/07/16 1322  BP: 110/70  Pulse: 83  Resp: 12  Temp: 98.1 F (36.7 C)   Body mass index is 29.44 kg/m.    Physical Exam  Nursing note and vitals reviewed. Constitutional: He is oriented  to person, place, and time. He appears well-developed. He does not appear ill. No distress.  HENT:  Head: Atraumatic.  Mouth/Throat: Oropharynx is clear and moist and mucous membranes are normal.  Eyes: Conjunctivae and EOM are normal.  Cardiovascular: Normal rate and regular rhythm.   No murmur heard. Respiratory: Effort normal and breath sounds normal. No respiratory distress.  GI: Soft. Bowel sounds are normal. He exhibits no mass. There is no hepatomegaly. There is tenderness in the right upper quadrant and epigastric area. There is no rigidity, no  rebound, no guarding and no CVA tenderness. A hernia (small umbilical hernia, reduceable,no tender) is present.  Musculoskeletal: He exhibits no edema.  Pain upon palpation of right lumbar paraspinal muscles, L3-4  Lymphadenopathy:    He has no cervical adenopathy.       Right: No supraclavicular adenopathy present.       Left: No supraclavicular adenopathy present.  Neurological: He is alert and oriented to person, place, and time. He has normal strength. Coordination and gait normal.  Skin: Skin is warm. No rash noted. No erythema.  Psychiatric: He has a normal mood and affect.  Well groomed, good eye contact.      ASSESSMENT AND PLAN:     Peter Archer was seen today for ingestion.  Diagnoses and all orders for this visit:  Upper abdominal pain  We discussed possible causes: Gall bladder disease, GERD/dyspepsia,and less likely pancreatitis among some. A few labs ordered today.  Reviewing records he had similar symptoms in 2010, U/S negative for cholelithiasis. Clearly instructed about warning signs. RUQ U/S will be arranged , if negative and persistent pain abdominal CT + further work up will be considered. . Further recommendations will be given according to lab results.  F/U in 3-4 weeks.  -     Comprehensive metabolic panel -     US Abdomen Limited RUQ; Future  Dyspepsia  GERD precautions discussed. Omeprazole 40 mg daily. Avoid NSAID's.  F/U in 3-4 weeks.   -     omeprazole (PRILOSEC) 40 MG capsule; Take 1 capsule (40 mg total) by mouth daily.   After visit further review if records in 2012 also upper abdominal pain, which thought to be possible pancreatitis, also GERD. 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 Today I did not order lipase or amylase, I did not think pancreatitis was very likely, although it was discussed as  differential Dx. I will try to add labs to blood drown today, if not possible will wait for imaging and will follow symptoms, he voices understanding of warning signs.        Return in about 4 weeks (around 03/06/2016) for abdomen.     -[name] advised to return or notify a doctor immediately if symptoms worsen or new concerns arise.       Peter Archer G. Martinique, MD  [facility]. [place] office.

## 2016-02-07 NOTE — Patient Instructions (Signed)
A few things to remember from today's visit:   Upper abdominal pain - Plan: Comprehensive metabolic panel, US Abdomen Limited RUQ  Dyspepsia - Plan: omeprazole (PRILOSEC) 40 MG capsule    Avoid foods that make your symptoms worse, for example coffee, chocolate,pepermeint,alcohol, and greasy food. Raising the head of your bed about 6 inches may help with nocturnal symptoms.  Avoid tobacco use. Weight loss (if you are overweight). Avoid lying down for 3 hours after eating.  Instead 3 large meals daily try small and more frequent meals during the day.  Some medications we recommend for acid reflux treatment (proton pump inhibitors) can cause some problems in the long term: increase risk of osteoporosis, vitamin deficiencies,pneumonia, and more recently discovered that it can increase the risk of chronic kidney disease and might increase risk of dementia.  You should be evaluated immediately if bloody vomiting, bloody stools, black stools (like tar), difficulty swallowing, food gets stuck on the way down or choking when eating. Abnormal weight loss or severe abdominal pain.  If symptoms are not resolved sometimes endoscopy is necessary.     GET HELP RIGHT AWAY IF:   The pain does not go away within 2 hours.  Sudden severe/worsening pain.  You keep throwing up (vomiting).  The pain changes and is only in the right or left part of the belly.  Not being able to pass gas or poop.  You have bloody or tarry looking poop.   MAKE SURE YOU:   Understand these instructions.  Will watch your condition.  Will get help right away if you are not doing well or get worse.     Please be sure medication list is accurate. If a new problem present, please set up appointment sooner than planned today.

## 2016-02-08 LAB — AMYLASE: AMYLASE: 62 U/L (ref 27–131)

## 2016-02-08 LAB — LIPASE: LIPASE: 59 U/L (ref 11.0–59.0)

## 2016-02-08 NOTE — Addendum Note (Signed)
Addended by: Martinique, BETTY G on: 02/08/2016 07:52 AM   Modules accepted: Orders

## 2016-02-11 ENCOUNTER — Telehealth: Payer: Self-pay | Admitting: Gastroenterology

## 2016-02-11 NOTE — Telephone Encounter (Signed)
Left sample of Suprep up front to be picked up

## 2016-02-14 ENCOUNTER — Encounter: Payer: Self-pay | Admitting: Gastroenterology

## 2016-02-14 ENCOUNTER — Ambulatory Visit (AMBULATORY_SURGERY_CENTER): Payer: PRIVATE HEALTH INSURANCE | Admitting: Gastroenterology

## 2016-02-14 VITALS — BP 116/67 | HR 56 | Temp 97.3°F | Resp 9 | Ht 66.25 in | Wt 181.0 lb

## 2016-02-14 DIAGNOSIS — D124 Benign neoplasm of descending colon: Secondary | ICD-10-CM | POA: Diagnosis not present

## 2016-02-14 DIAGNOSIS — Z1212 Encounter for screening for malignant neoplasm of rectum: Secondary | ICD-10-CM

## 2016-02-14 DIAGNOSIS — D125 Benign neoplasm of sigmoid colon: Secondary | ICD-10-CM

## 2016-02-14 DIAGNOSIS — Z1211 Encounter for screening for malignant neoplasm of colon: Secondary | ICD-10-CM

## 2016-02-14 MED ORDER — SODIUM CHLORIDE 0.9 % IV SOLN: 500.0000 mL | INTRAVENOUS | Status: DC

## 2016-02-14 NOTE — Progress Notes (Signed)
To recovery, report to [facility], Therapist, sports, VSS

## 2016-02-14 NOTE — Op Note (Signed)
[facility] Patient Name: Peter Archer Procedure Date: 02/14/2016 10:13 AM MRN: [id number] Endoscopist: Milus Banister , MD Age: 51 Referring MD:  Date of Birth: Sep 03, 1965 Gender: Male Account #: [id number] Procedure:                Colonoscopy Indications:              Screening for colorectal malignant neoplasm Medicines:                Monitored Anesthesia Care Procedure:                Pre-Anesthesia Assessment:                           - Prior to the procedure, a History and Physical                            was performed, and patient medications and                            allergies were reviewed. The patient's tolerance of                            previous anesthesia was also reviewed. The risks                            and benefits of the procedure and the sedation                            options and risks were discussed with the patient.                            All questions were answered, and informed consent                            was obtained. Prior Anticoagulants: The patient has                            taken no previous anticoagulant or antiplatelet                            agents. ASA Grade Assessment: II - A patient with                            mild systemic disease. After reviewing the risks                            and benefits, the patient was deemed in                            satisfactory condition to undergo the procedure.                           After obtaining informed consent, the colonoscope  was passed under direct vision. Throughout the                            procedure, the patient's blood pressure, pulse, and                            oxygen saturations were monitored continuously. The                            Model CF-HQ190L [id number]) scope was introduced                            through the anus and advanced to the the cecum,                            identified by  appendiceal orifice and ileocecal                            valve. The colonoscopy was performed without                            difficulty. The patient tolerated the procedure                            well. The quality of the bowel preparation was                            excellent. The ileocecal valve, appendiceal                            orifice, and rectum were photographed. Scope In: 10:17:26 AM Scope Out: 10:29:55 AM Scope Withdrawal Time: 0 hours 11 minutes 9 seconds  Total Procedure Duration: 0 hours 12 minutes 29 seconds  Findings:                 Three sessile polyps were found in the sigmoid                            colon and descending colon. The polyps were 3 to 4                            mm in size. These polyps were removed with a cold                            snare. Resection and retrieval were complete.                           The exam was otherwise without abnormality on                            direct and retroflexion views. Complications:            No immediate complications. Estimated blood loss:  None. Estimated Blood Loss:     Estimated blood loss: none. Impression:               - Three 3 to 4 mm polyps in the sigmoid colon and                            in the descending colon, removed with a cold snare.                            Resected and retrieved.                           - The examination was otherwise normal on direct                            and retroflexion views. Recommendation:           - Patient has a contact number available for                            emergencies. The signs and symptoms of potential                            delayed complications were discussed with the                            patient. Return to normal activities tomorrow.                            Written discharge instructions were provided to the                            patient.                           - Resume  previous diet.                           - Continue present medications.                           You will receive a letter within 2-3 weeks with the                            pathology results and my final recommendations.                           If the polyp(s) is proven to be 'pre-cancerous' on                            pathology, you will need repeat colonoscopy in 3-5                            years. If the polyp(s) is NOT 'precancerous' on  pathology then you should repeat colon cancer                            screening in 10 years with colonoscopy without need                            for colon cancer screening by any method prior to                            then (including stool testing). Milus Banister, MD 02/14/2016 10:31:54 AM This report has been signed electronically.

## 2016-02-14 NOTE — Progress Notes (Signed)
Called to room to assist during endoscopic procedure.  Patient ID and intended procedure confirmed with present staff. Received instructions for my participation in the procedure from the performing physician.  

## 2016-02-14 NOTE — Patient Instructions (Signed)
Discharge instructions given. Handouts on polyps. Resume previous medications. YOU HAD AN ENDOSCOPIC PROCEDURE TODAY AT THE [facility]:   Refer to the procedure report that was given to you for any specific questions about what was found during the examination.  If the procedure report does not answer your questions, please call your gastroenterologist to clarify.  If you requested that your care partner not be given the details of your procedure findings, then the procedure report has been included in a sealed envelope for you to review at your convenience later.  YOU SHOULD EXPECT: Some feelings of bloating in the abdomen. Passage of more gas than usual.  Walking can help get rid of the air that was put into your GI tract during the procedure and reduce the bloating. If you had a lower endoscopy (such as a colonoscopy or flexible sigmoidoscopy) you may notice spotting of blood in your stool or on the toilet paper. If you underwent a bowel prep for your procedure, you may not have a normal bowel movement for a few days.  Please Note:  You might notice some irritation and congestion in your nose or some drainage.  This is from the oxygen used during your procedure.  There is no need for concern and it should clear up in a day or so.  SYMPTOMS TO REPORT IMMEDIATELY:   Following lower endoscopy (colonoscopy or flexible sigmoidoscopy):  Excessive amounts of blood in the stool  Significant tenderness or worsening of abdominal pains  Swelling of the abdomen that is new, acute  Fever of 100F or higher   For urgent or emergent issues, a gastroenterologist can be reached at any hour by calling [phone].   DIET:  We do recommend a small meal at first, but then you may proceed to your regular diet.  Drink plenty of fluids but you should avoid alcoholic beverages for 24 hours.  ACTIVITY:  You should plan to take it easy for the rest of today and you should NOT DRIVE or use heavy  machinery until tomorrow (because of the sedation medicines used during the test).    FOLLOW UP: Our staff will call the number listed on your records the next business day following your procedure to check on you and address any questions or concerns that you may have regarding the information given to you following your procedure. If we do not reach you, we will leave a message.  However, if you are feeling well and you are not experiencing any problems, there is no need to return our call.  We will assume that you have returned to your regular daily activities without incident.  If any biopsies were taken you will be contacted by phone or by letter within the next 1-3 weeks.  Please call us at [phone] if you have not heard about the biopsies in 3 weeks.    SIGNATURES/CONFIDENTIALITY: You and/or your care partner have signed paperwork which will be entered into your electronic medical record.  These signatures attest to the fact that that the information above on your After Visit Summary has been reviewed and is understood.  Full responsibility of the confidentiality of this discharge information lies with you and/or your care-partner. 

## 2016-02-15 ENCOUNTER — Ambulatory Visit
Admission: RE | Admit: 2016-02-15 | Discharge: 2016-02-15 | Disposition: A | Discharge status: Home / Self Care | Payer: PRIVATE HEALTH INSURANCE | Source: Ambulatory Visit | Attending: Family Medicine | Admitting: Family Medicine

## 2016-02-15 ENCOUNTER — Telehealth: Payer: Self-pay

## 2016-02-15 DIAGNOSIS — R1010 Upper abdominal pain, unspecified: Secondary | ICD-10-CM

## 2016-02-15 NOTE — Telephone Encounter (Signed)
  Follow up Call-  Call back number 02/14/2016  Post procedure Call Back phone  # [phone]  Permission to leave phone message Yes     Patient questions:  Do you have a fever, pain , or abdominal swelling? No. Pain Score  0 *  Have you tolerated food without any problems? Yes.    Have you been able to return to your normal activities? Yes.    Do you have any questions about your discharge instructions: Diet   No. Medications  No. Follow up visit  No.  Do you have questions or concerns about your Care? No.  Actions: * If pain score is 4 or above: No action needed, pain <4.

## 2016-02-21 ENCOUNTER — Encounter: Payer: Self-pay | Admitting: Gastroenterology

## 2016-03-13 ENCOUNTER — Ambulatory Visit: Payer: Self-pay | Admitting: Family Medicine

## 2016-04-03 ENCOUNTER — Encounter: Payer: Self-pay | Admitting: Family Medicine

## 2016-05-21 NOTE — Progress Notes (Deleted)
     HPI:  [name] is a 51 y.o.male here today for his routine physical examination.  He was last seen on 02/07/16, since his last OV he has followed with GI for colonoscopy.  He lives with ***  Regular exercise 3 or more times per week: *** Following a healthy diet: ***   Chronic medical problems: seasonal allergies.  Hx of STD's: ***   There is no immunization history on file for this patient.    -Hep C screening (if born 58-1967): ***   Last colon cancer screening: 02/2016. Last prostate ca screening: ***  -Denies high alcohol intake, tobacco use, or Hx of illicit drug use.  -Concerns and/or follow up today: ***    Review of Systems   Current Outpatient Prescriptions on File Prior to Visit  Medication Sig Dispense Refill  . omeprazole (PRILOSEC) 40 MG capsule Take 1 capsule (40 mg total) by mouth daily. 30 capsule 1   Current Facility-Administered Medications on File Prior to Visit  Medication Dose Route Frequency Provider Last Rate Last Dose  . 0.9 %  sodium chloride infusion  500 mL Intravenous Continuous Milus Banister, MD         Past Medical History:  Diagnosis Date  . 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 Allergy   . Inguinal hernia    left   . Umbilical hernia     Allergies  Allergen Reactions  . Codeine Anaphylaxis    Family History  Problem Relation Age of Onset  . Heart disease Father   . Stroke Paternal Grandmother   . Cancer Paternal Grandmother     colon  . Colon cancer Neg Hx   . Colon polyps Neg Hx     Social History   Social History  . Marital status: Divorced    Spouse name: N/A  . Number of children: N/A  . Years of education: N/A   Social History Main Topics  . Smoking status: Never Smoker  . Smokeless tobacco: Never Used  . Alcohol use No  . Drug use: No  . Sexual activity: Yes   Other  Topics Concern  . Not on file   Social History Narrative  . No narrative on file     There were no vitals filed for this visit. There is no height or weight on file to calculate BMI.  @LASTSAO2 (3)@  Wt Readings from Last 3 Encounters:  02/14/16 181 lb (82.1 kg)  02/07/16 182 lb 6.4 oz (82.7 kg)  02/07/16 181 lb (82.1 kg)        Physical Exam      ASSESSMENT AND PLAN:   Discussed the following assessment and plan:   There are no diagnoses linked to this encounter.       No Follow-up on file.    Peter Watterson G. Martinique, MD  [facility]. [place] office.

## 2016-05-22 ENCOUNTER — Encounter: Payer: Self-pay | Admitting: Family Medicine

## 2016-07-08 ENCOUNTER — Encounter (HOSPITAL_COMMUNITY): Payer: Self-pay

## 2016-07-08 ENCOUNTER — Emergency Department (HOSPITAL_COMMUNITY)
Admission: EM | Admit: 2016-07-08 | Discharge: 2016-07-08 | Disposition: A | Discharge status: Home / Self Care | Payer: Self-pay | Attending: Emergency Medicine | Admitting: Emergency Medicine

## 2016-07-08 DIAGNOSIS — S30861A Insect bite (nonvenomous) of abdominal wall, initial encounter: Secondary | ICD-10-CM | POA: Insufficient documentation

## 2016-07-08 DIAGNOSIS — Z79899 Other long term (current) drug therapy: Secondary | ICD-10-CM | POA: Insufficient documentation

## 2016-07-08 DIAGNOSIS — Y999 Unspecified external cause status: Secondary | ICD-10-CM | POA: Insufficient documentation

## 2016-07-08 DIAGNOSIS — Y939 Activity, unspecified: Secondary | ICD-10-CM | POA: Insufficient documentation

## 2016-07-08 DIAGNOSIS — W57XXXA Bitten or stung by nonvenomous insect and other nonvenomous arthropods, initial encounter: Secondary | ICD-10-CM | POA: Insufficient documentation

## 2016-07-08 DIAGNOSIS — Y929 Unspecified place or not applicable: Secondary | ICD-10-CM | POA: Insufficient documentation

## 2016-07-08 MED ORDER — DOXYCYCLINE HYCLATE 100 MG PO CAPS: 100.0000 mg | ORAL_CAPSULE | Freq: Two times a day (BID) | ORAL | 0 refills | Status: DC

## 2016-07-08 NOTE — ED Triage Notes (Addendum)
Pt c/o tick bite to groin since Wednesday. Tick was removed on thursday. Friday, a raised spot of redness and swelling a couple of inches in diameter, has gotten worse.

## 2016-07-08 NOTE — ED Provider Notes (Signed)
[facility] DEPT Provider Note   CSN: [id number] Arrival date & time: 07/08/16  2225     History   Chief Complaint Chief Complaint  Patient presents with  . Insect Bite    Tick    HPI Peter Archer is a 51 y.o. male.  Peter Archer is a 51 y.o. Male who presents to the ED complaining of a tick bite 4 days ago. Patient reports he noticed a tick bite to his left groin area 4 days ago. He reports removing the tick with tweezers was attached to his pubic hair area of his groin. He reports since he's developed some redness around the area where he had the tick bite. He is worried about infection. He denies any discharge from the area. He denies any fevers, body aches, abdominal pain or other complaints.   The history is provided by the patient and medical records. No language interpreter was used.    Past Medical History:  Diagnosis Date  . 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 Allergy   . Inguinal hernia    left   . Umbilical hernia     There are no active problems to display for this patient.   Past Surgical History:  Procedure Laterality Date  . APPENDECTOMY  1987  . DENTAL SURGERY         Home Medications    Prior to Admission medications   Medication Sig Start Date End Date Taking? Authorizing Provider  doxycycline (VIBRAMYCIN) 100 MG capsule Take 1 capsule (100 mg total) by mouth 2 (two) times daily. 07/08/16   [name], PA-C  omeprazole (PRILOSEC) 40 MG capsule Take 1 capsule (40 mg total) by mouth daily. 02/07/16   Martinique, Betty G, MD    Family History Family History  Problem Relation Age of Onset  . Heart disease Father   . Stroke Paternal Grandmother   . Cancer Paternal Grandmother     colon  . Colon cancer Neg Hx   . Colon polyps Neg Hx     Social History Social History  Substance Use Topics  . Smoking status: Never Smoker    . Smokeless tobacco: Never Used  . Alcohol use No     Allergies   Codeine   Review of Systems Review of Systems  Constitutional: Negative for fever.  Gastrointestinal: Negative for abdominal pain, nausea and vomiting.  Genitourinary: Negative for genital sores.  Musculoskeletal: Negative for arthralgias and myalgias.  Skin: Positive for color change and rash.  Neurological: Negative for weakness.     Physical Exam Updated Vital Signs BP 127/89   Pulse 78   Temp 98.6 F (37 C) (Oral)   Resp 18   SpO2 98%   Physical Exam  Constitutional: He appears well-developed and well-nourished. No distress.  Nontoxic appearing.  HENT:  Head: Normocephalic and atraumatic.  Eyes: Right eye exhibits no discharge. Left eye exhibits no discharge.  Pulmonary/Chest: Effort normal. No respiratory distress.  Genitourinary:  Genitourinary Comments: Small area of erythema noted to his left pubic area and his pubic hair. No discharge, fluctuance or streaking erythema. No evidence of retained foreign body or other ticks.   Neurological: He is alert. Coordination normal.  Skin: Skin is warm and dry. Capillary refill takes less than 2 seconds. No rash noted. He is not diaphoretic. There is erythema. No pallor.  Psychiatric: He has a normal mood and affect. His behavior is normal.  Nursing note and vitals reviewed.    ED Treatments / Results  Labs (all labs ordered are listed, but only abnormal results are displayed) Labs Reviewed - No data to display  EKG  EKG Interpretation None       Radiology No results found.  Procedures Procedures (including critical care time)  Medications Ordered in ED Medications - No data to display   Initial Impression / Assessment and Plan / ED Course  I have reviewed the triage vital signs and the nursing notes.  Pertinent labs & imaging results that were available during my care of the patient were reviewed by me and considered in my medical  decision making (see chart for details).    This is a 51 y.o. Male who presents to the ED complaining of a tick bite 4 days ago. Patient reports he noticed a tick bite to his left groin area 4 days ago. He reports removing the tick with tweezers was attached to his pubic hair area of his groin. He reports since he's developed some redness around the area where he had the tick bite. He denies fevers or body aches. On exam he has a small area of erythema noted to his left upper pubic area and his pubic hair. No evidence of abscess or drainage. No other erythema or insect bites noted. Will cover with doxycycline for infection. Encourage follow-up by his primary care doctor. I discussed strict and specific return precautions. I advised the patient to follow-up with their primary care provider this week. I advised the patient to return to the emergency department with new or worsening symptoms or new concerns. The patient verbalized understanding and agreement with plan.      Final Clinical Impressions(s) / ED Diagnoses   Final diagnoses:  Tick bite, initial encounter    New Prescriptions New Prescriptions   DOXYCYCLINE (VIBRAMYCIN) 100 MG CAPSULE    Take 1 capsule (100 mg total) by mouth 2 (two) times daily.     [name], PA-C 28/78/67 [id number]    Delora Fuel, MD 94/70/96 [phone]

## 2016-07-08 NOTE — ED Notes (Signed)
Bed: WTR6 Expected date:  Expected time:  Means of arrival:  Comments: 

## 2017-08-28 ENCOUNTER — Ambulatory Visit (INDEPENDENT_AMBULATORY_CARE_PROVIDER_SITE_OTHER): Payer: No Typology Code available for payment source | Admitting: Orthopaedic Surgery

## 2017-08-28 DIAGNOSIS — S62663A Nondisplaced fracture of distal phalanx of left middle finger, initial encounter for closed fracture: Secondary | ICD-10-CM | POA: Insufficient documentation

## 2017-08-28 NOTE — Progress Notes (Signed)
Office Visit Note   Patient: Peter Archer           Date of Birth: 1965/06/11           MRN: [id number] Visit Date: 08/28/2017              Requested by: Martinique, Betty G, MD [address] [place], [place] [address] PCP: Martinique, Betty G, MD   Assessment & Plan: Visit Diagnoses:  1. Closed nondisplaced fracture of distal phalanx of left middle finger, initial encounter     Plan: Impression is left middle fingertip crush injury with nondisplaced distal phalanx fracture.  Overall expect this to heal fine.  We did discuss that he is likely going to have sensitivity in the fingertip for several months may be even up upwards of a year.  We will refer him to hand therapy so that he can regain his motion and hopefully decrease the sensitivity and the swelling.  I put him on work restrictions for the next 6 weeks.  I will recheck him in 6 weeks.  2 view x-rays of the left middle finger on return. Total face to face encounter time was greater than 45 minutes and over half of this time was spent in counseling and/or coordination of care.  Follow-Up Instructions: Return in about 6 weeks (around 10/09/2017).   Orders:  No orders of the defined types were placed in this encounter.  No orders of the defined types were placed in this encounter.     Procedures: No procedures performed   Clinical Data: No additional findings.   Subjective: Chief Complaint  Patient presents with  . Left Middle Finger - Injury, Pain    S/p mashing injury 07/16/17    Peter Archer is a 52 year old gentleman who comes in with an injury to his left middle fingertip that he sustained at work on 07/16/2017.  This was crushed between 2 objects.  He builds trucks for living.  He has been evaluated by the urgent care and has been referred here.  His nail was trephinated to relieve the subungual hematoma.  He still complains of sensitivity mainly to touch.  He has been wearing a splint for the finger.  Denies any  numbness and tingling.   Review of Systems  Constitutional: Negative.   All other systems reviewed and are negative.    Objective: Vital Signs: There were no vitals taken for this visit.  Physical Exam  Constitutional: He is oriented to person, place, and time. He appears well-developed and well-nourished.  HENT:  Head: Normocephalic and atraumatic.  Eyes: Pupils are equal, round, and reactive to light.  Neck: Neck supple.  Pulmonary/Chest: Effort normal.  Abdominal: Soft.  Musculoskeletal: Normal range of motion.  Neurological: He is alert and oriented to person, place, and time.  Skin: Skin is warm.  Psychiatric: He has a normal mood and affect. His behavior is normal. Judgment and thought content normal.  Nursing note and vitals reviewed.   Ortho Exam Left middle finger exam shows moderate swelling.  There is stiffness of the DIP joint from prolonged immobilization.  No neurovascular compromise.  Nail is tentatively intact. Specialty Comments:  No specialty comments available.  Imaging: No results found.   PMFS History: Patient Active Problem List   Diagnosis Date Noted  . Closed nondisplaced fracture of distal phalanx of left middle finger 08/28/2017   Past Medical History:  Diagnosis Date  . Abnormal CT of the abdomen    benign cyst in  abdomen told several years ( 6-7) ago but never had it rechecked - [facility]- told benign   . Allergy   . Inguinal hernia    left   . Umbilical hernia     Family History  Problem Relation Age of Onset  . Heart disease Father   . Stroke Paternal Grandmother   . Cancer Paternal Grandmother        colon  . Colon cancer Neg Hx   . Colon polyps Neg Hx     Past Surgical History:  Procedure Laterality Date  . APPENDECTOMY  1987  . DENTAL SURGERY     Social History   Occupational History  . Not on file  Tobacco Use  . Smoking status: Never Smoker  . Smokeless tobacco: Never Used  Substance and Sexual Activity    . Alcohol use: No  . Drug use: No  . Sexual activity: Yes

## 2017-09-03 ENCOUNTER — Telehealth (INDEPENDENT_AMBULATORY_CARE_PROVIDER_SITE_OTHER): Payer: Self-pay | Admitting: Orthopaedic Surgery

## 2017-09-03 NOTE — Telephone Encounter (Signed)
P.T. Order faxed to [facility] [phone]

## 2017-10-12 ENCOUNTER — Ambulatory Visit (INDEPENDENT_AMBULATORY_CARE_PROVIDER_SITE_OTHER): Payer: No Typology Code available for payment source

## 2017-10-12 ENCOUNTER — Ambulatory Visit (INDEPENDENT_AMBULATORY_CARE_PROVIDER_SITE_OTHER): Payer: No Typology Code available for payment source | Admitting: Physician Assistant

## 2017-10-12 ENCOUNTER — Encounter (INDEPENDENT_AMBULATORY_CARE_PROVIDER_SITE_OTHER): Payer: Self-pay | Admitting: Orthopaedic Surgery

## 2017-10-12 DIAGNOSIS — S62663A Nondisplaced fracture of distal phalanx of left middle finger, initial encounter for closed fracture: Secondary | ICD-10-CM

## 2017-10-12 NOTE — Progress Notes (Signed)
   Post-Op Visit Note   Patient: Peter Archer           Date of Birth: 1965-10-28           MRN: [id number] Visit Date: 10/12/2017 PCP: Martinique, Betty G, MD   Assessment & Plan:  Chief Complaint:  Chief Complaint  Patient presents with  . Hand Pain    f/u finger fx   Visit Diagnoses:  1. Closed nondisplaced fracture of distal phalanx of left middle finger, initial encounter     Plan: Patient is a pleasant 52 year old gentleman who presents to our clinic today nearly 12 weeks out from a distal phalanx fracture to the left middle finger, date of injury 07/16/2017.  This was a crush injury which occurred at work and is under [employer].  He has been in formal hand therapy making great progress.  He still admits to mild to moderate pain to the entire finger.  Mild swelling.  He does note that his sensation is starting to return.  He has been on light duty at work and doing well with this.  Examination of his left long finger reveals no evidence of infection.  His does have ecchymosis to the nailbed.  The nail is fully intact.  Moderate tenderness to the distal half of the finger.  Great range of motion.  Decreased sensation.  At this point, we will have the patient continue his work restrictions for another 4 weeks.  He will follow-up with [place] in 4 weeks time for repeat exam and x-ray.  Follow-Up Instructions: Return in about 4 weeks (around 11/09/2017).   Orders:  Orders Placed This Encounter  Procedures  . XR Finger Middle Left   No orders of the defined types were placed in this encounter.   Imaging: Xr Finger Middle Left  Result Date: 10/12/2017 Stable alignment of the fracture with bony consolidation   PMFS History: Patient Active Problem List   Diagnosis Date Noted  . Closed nondisplaced fracture of distal phalanx of left middle finger 08/28/2017   Past Medical History:  Diagnosis Date  . Abnormal CT of the abdomen    benign cyst in abdomen told several years ( 6-7)  ago but never had it rechecked - [facility]- told benign   . Allergy   . Inguinal hernia    left   . Umbilical hernia     Family History  Problem Relation Age of Onset  . Heart disease Father   . Stroke Paternal Grandmother   . Cancer Paternal Grandmother        colon  . Colon cancer Neg Hx   . Colon polyps Neg Hx     Past Surgical History:  Procedure Laterality Date  . APPENDECTOMY  1987  . DENTAL SURGERY     Social History   Occupational History  . Not on file  Tobacco Use  . Smoking status: Never Smoker  . Smokeless tobacco: Never Used  Substance and Sexual Activity  . Alcohol use: No  . Drug use: No  . Sexual activity: Yes

## 2017-11-09 ENCOUNTER — Ambulatory Visit (INDEPENDENT_AMBULATORY_CARE_PROVIDER_SITE_OTHER): Payer: No Typology Code available for payment source | Admitting: Orthopaedic Surgery

## 2017-11-09 ENCOUNTER — Ambulatory Visit (INDEPENDENT_AMBULATORY_CARE_PROVIDER_SITE_OTHER): Payer: No Typology Code available for payment source

## 2017-11-09 DIAGNOSIS — S62663A Nondisplaced fracture of distal phalanx of left middle finger, initial encounter for closed fracture: Secondary | ICD-10-CM

## 2017-11-09 DIAGNOSIS — S62663D Nondisplaced fracture of distal phalanx of left middle finger, subsequent encounter for fracture with routine healing: Secondary | ICD-10-CM

## 2017-11-09 NOTE — Progress Notes (Signed)
   Office Visit Note   Patient: Peter Archer           Date of Birth: [birth year]           MRN: [id number] Visit Date: 11/09/2017              Requested by: Martinique, Betty G, MD [address] [place], [place] [address] PCP: Martinique, Betty G, MD   Assessment & Plan: Visit Diagnoses:  1. Closed nondisplaced fracture of distal phalanx of left middle finger, initial encounter     Plan: At this point well and has fully healed his fracture.  His swelling is not really bothering him that badly.  He is released back to full duty Monday.  Questions encouraged and answered.  Follow-up as needed.  Follow-Up Instructions: Return if symptoms worsen or fail to improve.   Orders:  Orders Placed This Encounter  Procedures  . XR Finger Middle Left   No orders of the defined types were placed in this encounter.     Procedures: No procedures performed   Clinical Data: No additional findings.   Subjective: Chief Complaint  Patient presents with  . Left Hand - Follow-up    Peter Archer follows up today for his distal phalanx fracture.  He is doing well.  He has been essentially doing full duty.  He does have a little bit of swelling but this does not significantly limit him or bother him.   Review of Systems   Objective: Vital Signs: There were no vitals taken for this visit.  Physical Exam  Ortho Exam Middle finger exam shows mild persistent swelling.  He is almost able to make a full composite fist.  No compromise.  He does have presence of a new nail. Specialty Comments:  No specialty comments available.  Imaging: Xr Finger Middle Left  Result Date: 11/09/2017 Healed distal phalanx fracture.    PMFS History: Patient Active Problem List   Diagnosis Date Noted  . Closed nondisplaced fracture of distal phalanx of left middle finger 08/28/2017   Past Medical History:  Diagnosis Date  . Abnormal CT of the abdomen    benign cyst in abdomen told several years ( 6-7)  ago but never had it rechecked - [facility]- told benign   . Allergy   . Inguinal hernia    left   . Umbilical hernia     Family History  Problem Relation Age of Onset  . Heart disease Father   . Stroke Paternal Grandmother   . Cancer Paternal Grandmother        colon  . Colon cancer Neg Hx   . Colon polyps Neg Hx     Past Surgical History:  Procedure Laterality Date  . APPENDECTOMY  1987  . DENTAL SURGERY     Social History   Occupational History  . Not on file  Tobacco Use  . Smoking status: Never Smoker  . Smokeless tobacco: Never Used  Substance and Sexual Activity  . Alcohol use: No  . Drug use: No  . Sexual activity: Yes

## 2018-03-03 ENCOUNTER — Emergency Department (HOSPITAL_COMMUNITY)
Admission: EM | Admit: 2018-03-03 | Discharge: 2018-03-03 | Disposition: A | Discharge status: Home / Self Care | Payer: BLUE CROSS/BLUE SHIELD | Attending: Emergency Medicine | Admitting: Emergency Medicine

## 2018-03-03 ENCOUNTER — Encounter (HOSPITAL_COMMUNITY): Payer: Self-pay | Admitting: Emergency Medicine

## 2018-03-03 ENCOUNTER — Emergency Department (HOSPITAL_COMMUNITY): Payer: BLUE CROSS/BLUE SHIELD

## 2018-03-03 DIAGNOSIS — R05 Cough: Secondary | ICD-10-CM | POA: Diagnosis present

## 2018-03-03 DIAGNOSIS — J069 Acute upper respiratory infection, unspecified: Secondary | ICD-10-CM | POA: Insufficient documentation

## 2018-03-03 DIAGNOSIS — R1013 Epigastric pain: Secondary | ICD-10-CM

## 2018-03-03 DIAGNOSIS — Z79899 Other long term (current) drug therapy: Secondary | ICD-10-CM | POA: Insufficient documentation

## 2018-03-03 DIAGNOSIS — R0789 Other chest pain: Secondary | ICD-10-CM | POA: Diagnosis not present

## 2018-03-03 DIAGNOSIS — R059 Cough, unspecified: Secondary | ICD-10-CM

## 2018-03-03 LAB — CBC
HCT: 47.4 % (ref 39.0–52.0)
HEMOGLOBIN: 15.6 g/dL (ref 13.0–17.0)
MCH: 28.4 pg (ref 26.0–34.0)
MCHC: 32.9 g/dL (ref 30.0–36.0)
MCV: 86.2 fL (ref 80.0–100.0)
NRBC: 0 % (ref 0.0–0.2)
Platelets: 227 10*3/uL (ref 150–400)
RBC: 5.5 MIL/uL (ref 4.22–5.81)
RDW: 12.6 % (ref 11.5–15.5)
WBC: 6.8 10*3/uL (ref 4.0–10.5)

## 2018-03-03 LAB — BASIC METABOLIC PANEL
Anion gap: 8 (ref 5–15)
BUN: 9 mg/dL (ref 6–20)
CO2: 25 mmol/L (ref 22–32)
Calcium: 8.8 mg/dL — ABNORMAL LOW (ref 8.9–10.3)
Chloride: 107 mmol/L (ref 98–111)
Creatinine, Ser: 0.87 mg/dL (ref 0.61–1.24)
GFR calc Af Amer: >60 mL/min (ref >60)
GFR calc non Af Amer: >60 mL/min (ref >60)
Glucose, Bld: 103 mg/dL — ABNORMAL HIGH (ref 70–99)
POTASSIUM: 4 mmol/L (ref 3.5–5.1)
Sodium: 140 mmol/L (ref 135–145)

## 2018-03-03 LAB — I-STAT TROPONIN, ED: Troponin i, poc: 0 ng/mL (ref 0.00–0.08)

## 2018-03-03 MED ORDER — OMEPRAZOLE 40 MG PO CPDR: 40.0000 mg | DELAYED_RELEASE_CAPSULE | Freq: Every day | ORAL | 0 refills | Status: DC

## 2018-03-03 NOTE — ED Provider Notes (Signed)
[facility] DEPT Provider Note   CSN: [id number] Arrival date & time: 03/03/18  1211     History   Chief Complaint Chief Complaint  Patient presents with  . Chest Pain  . Cough    HPI Peter Archer is a 52 y.o. male.  The history is provided by the patient.  Cough  This is a new problem. The current episode started more than 1 week ago. Episode frequency: daily. The problem has not changed since onset.The cough is non-productive. There has been no fever. The fever has been present for less than 1 day. Associated symptoms include chest pain. Pertinent negatives include no chills, no sweats, no weight loss, no ear pain, no sore throat, no myalgias, no shortness of breath and no wheezing. He has tried nothing for the symptoms. The treatment provided no relief.    Past Medical History:  Diagnosis Date  . 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 Allergy   . Inguinal hernia    left   . Umbilical hernia     Patient Active Problem List   Diagnosis Date Noted  . Closed nondisplaced fracture of distal phalanx of left middle finger 08/28/2017    Past Surgical History:  Procedure Laterality Date  . APPENDECTOMY  1987  . DENTAL SURGERY          Home Medications    Prior to Admission medications   Medication Sig Start Date End Date Taking? Authorizing Provider  ibuprofen (ADVIL,MOTRIN) 200 MG tablet Take 400 mg by mouth every 6 (six) hours as needed for moderate pain.   Yes [provider]  Multiple Vitamins-Minerals (ADULT ONE DAILY GUMMIES PO) Take 2 each by mouth 2 (two) times a week.   Yes [provider]  doxycycline (VIBRAMYCIN) 100 MG capsule Take 1 capsule (100 mg total) by mouth 2 (two) times daily. Patient not taking: Reported on 03/03/2018 07/08/16   [name], PA-C  omeprazole (PRILOSEC) 40 MG capsule  Take 1 capsule (40 mg total) by mouth daily. 03/03/18 04/02/18  [name], DO    Family History Family History  Problem Relation Age of Onset  . Heart disease Father   . Stroke Paternal Grandmother   . Cancer Paternal Grandmother        colon  . Colon cancer Neg Hx   . Colon polyps Neg Hx     Social History Social History   Tobacco Use  . Smoking status: Never Smoker  . Smokeless tobacco: Never Used  Substance Use Topics  . Alcohol use: No  . Drug use: No     Allergies   Codeine   Review of Systems Review of Systems  Constitutional: Negative for chills, fever and weight loss.  HENT: Negative for ear pain and sore throat.   Eyes: Negative for pain and visual disturbance.  Respiratory: Positive for cough. Negative for shortness of breath and wheezing.   Cardiovascular: Positive for chest pain. Negative for palpitations.  Gastrointestinal: Negative for abdominal pain and vomiting.  Genitourinary: Negative for dysuria and hematuria.  Musculoskeletal: Negative for arthralgias, back pain and myalgias.  Skin: Negative for color change and rash.  Neurological: Negative for seizures and syncope.  All other systems reviewed and are negative.    Physical Exam Updated Vital Signs  ED Triage Vitals  Enc Vitals Group  BP 03/03/18 1221 (!) 118/101     Pulse Rate 03/03/18 1221 63     Resp 03/03/18 1221 10     Temp 03/03/18 1221 98.4 F (36.9 C)     Temp Source 03/03/18 1221 Oral     SpO2 03/03/18 1221 100 %     Weight 03/03/18 1219 190 lb (86.2 kg)     Height 03/03/18 1219 5\' 8"  (1.727 m)     Head Circumference --      Peak Flow --      Pain Score 03/03/18 1546 5     Pain Loc --      Pain Edu? --      Excl. in [place]? --     Physical Exam Vitals signs and nursing note reviewed.  Constitutional:      Appearance: He is well-developed.  HENT:     Head: Normocephalic and atraumatic.  Eyes:     Extraocular Movements: Extraocular movements intact.      Conjunctiva/sclera: Conjunctivae normal.     Pupils: Pupils are equal, round, and reactive to light.  Neck:     Musculoskeletal: Normal range of motion and neck supple.  Cardiovascular:     Rate and Rhythm: Normal rate and regular rhythm.     Heart sounds: No murmur.  Pulmonary:     Effort: Pulmonary effort is normal. No respiratory distress.     Breath sounds: Normal breath sounds. No decreased breath sounds, wheezing, rhonchi or rales.  Chest:     Chest wall: No mass or tenderness.  Abdominal:     Palpations: Abdomen is soft.     Tenderness: There is no abdominal tenderness.  Musculoskeletal: Normal range of motion.     Right lower leg: No edema.     Left lower leg: No edema.  Skin:    General: Skin is warm and dry.     Capillary Refill: Capillary refill takes less than 2 seconds.  Neurological:     General: No focal deficit present.     Mental Status: He is alert.      ED Treatments / Results  Labs (all labs ordered are listed, but only abnormal results are displayed) Labs Reviewed  BASIC METABOLIC PANEL - Abnormal; Notable for the following components:      Result Value   Glucose, Bld 103 (*)    Calcium 8.8 (*)    All other components within normal limits  CBC  I-STAT TROPONIN, ED    EKG EKG Interpretation  Date/Time:  Sunday March 03 2018 12:20:35 EST Ventricular Rate:  62 PR Interval:    QRS Duration: 107 QT Interval:  411 QTC Calculation: 418 R Axis:   27 Text Interpretation:  Sinus rhythm RSR' in V1 or V2, right VCD or RVH Confirmed by [name] [phone]) on 03/03/2018 2:15:32 PM   Radiology Dg Chest 2 View  Result Date: 03/03/2018 CLINICAL DATA:  Chest pain, shortness of breath, and cough. EXAM: CHEST - 2 VIEW COMPARISON:  None. FINDINGS: The heart size and mediastinal contours are within normal limits. Both lungs are clear. The visualized skeletal structures are unremarkable. IMPRESSION: No active cardiopulmonary disease. Electronically Signed    By: Lorriane Shire M.D.   On: 03/03/2018 13:23    Procedures Procedures (including critical care time)  Medications Ordered in ED Medications - No data to display   Initial Impression / Assessment and Plan / ED Course  I have reviewed the triage vital signs and the nursing notes.  Pertinent  labs & imaging results that were available during my care of the patient were reviewed by me and considered in my medical decision making (see chart for details).     Mattheus Rauls is a 52 year old male with no significant medical history who presents to the ED with cough.  Patient with normal vitals.  No fever.  Patient with cough for the last several days/week that is intermittent.  Cough is dry.  He has had some chest pain with the cough as well.  States that pain is burning when he does have it.  States that he does not use alcohol or smoke.  Does not have any cardiac risk factors.  No hypertension, no diabetes, no high cholesterol.  Patient states that he has used omeprazole in the past with relief.  Patient with clear breath sounds on exam.  Patient has no PE risk factors.  Doubt PE.  EKG showed sinus rhythm.  Troponin normal.  Doubt cardiac process.  History and physical not consistent with dissection.  Normal pulses throughout.  Patient had chest x-ray that showed no signs of pneumonia, pneumothorax, pleural effusion.  Patient had no significant electrolyte abnormality, kidney injury, leukocytosis.  Overall suspect postviral cough syndrome.  Versus possibly reflux causing chronic cough.  Patient used to be on omeprazole and will prescribe again.  Patient overall with atypical chest pain.  Likely secondary to cough.  Recommend use of warm tea and honey.  Given information to follow-up with primary care doctor and discharged from ED in good condition.  Told to return to ED if symptoms worsen.  This chart was dictated using voice recognition software.  Despite best efforts to proofread,  errors can  occur which can change the documentation meaning.  Final Clinical Impressions(s) / ED Diagnoses   Final diagnoses:  Cough  Upper respiratory tract infection, unspecified type    ED Discharge Orders         Ordered    omeprazole (PRILOSEC) 40 MG capsule  Daily     03/03/18 [place]er, [place], DO 03/03/18 1617

## 2018-03-03 NOTE — ED Triage Notes (Signed)
Pt reports that had cough for couple weeks. This morning he woke up with severe central chest pains.

## 2018-04-01 NOTE — Progress Notes (Signed)
Subjective:    Patient ID: Peter Archer, male    DOB: 1965/09/25, 53 y.o.   MRN: [id number]  52 y.o.M seen 12/29 at ED for post viral cough All testing neg.  Also recent distal left middle finger fracture rx per ortho.   Coughed for two weeks.  ED visit neg.   Smoked when younger, not since      Cough  This is a new problem. The current episode started 1 to 4 weeks ago. The problem has been unchanged. The cough is productive of sputum and productive of purulent sputum. Associated symptoms include chest pain, ear congestion, ear pain, headaches, nasal congestion, postnasal drip, rhinorrhea, shortness of breath and wheezing. Pertinent negatives include no fever, heartburn, hemoptysis, myalgias, rash or sore throat. Associated symptoms comments: Sinus pressure , throat is dry in AM when first wake up. The symptoms are aggravated by cold air, other, lying down and fumes. He has tried nothing for the symptoms. There is no history of asthma, bronchitis, COPD or pneumonia.   Past Medical History:  Diagnosis Date  . 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 Allergy   . Inguinal hernia    left   . Umbilical hernia      Family History  Problem Relation Age of Onset  . Heart disease Father   . Stroke Paternal Grandmother   . Cancer Paternal Grandmother        colon  . Colon cancer Neg Hx   . Colon polyps Neg Hx      Social History   Socioeconomic History  . Marital status: Divorced    Spouse name: Not on file  . Number of children: Not on file  . Years of education: Not on file  . Highest education level: Not on file  Occupational History  . Not on file  Social Needs  . Financial resource strain: Not on file  . Food insecurity:    Worry: Not on file    Inability: Not on file  . Transportation needs:    Medical: Not on file    Non-medical: Not on file  Tobacco Use    . Smoking status: Never Smoker  . Smokeless tobacco: Never Used  Substance and Sexual Activity  . Alcohol use: No  . Drug use: No  . Sexual activity: Yes  Lifestyle  . Physical activity:    Days per week: Not on file    Minutes per session: Not on file  . Stress: Not on file  Relationships  . Social connections:    Talks on phone: Not on file    Gets together: Not on file    Attends religious service: Not on file    Active member of club or organization: Not on file    Attends meetings of clubs or organizations: Not on file    Relationship status: Not on file  . Intimate partner violence:    Fear of current or ex partner: Not on file    Emotionally abused: Not on file    Physically abused: Not on file    Forced sexual activity: Not on file  Other Topics Concern  . Not on file  Social History Narrative  . Not on file     Allergies  Allergen Reactions  . Codeine Anaphylaxis     Outpatient Medications Prior to Visit  Medication Sig Dispense Refill  . Multiple Vitamins-Minerals (ADULT ONE DAILY GUMMIES PO) Take 2 each by mouth 2 (two) times a week.    Marland Kitchen omeprazole (PRILOSEC) 40 MG capsule Take 1 capsule (40 mg total) by mouth daily. 30 capsule 0  . ibuprofen (ADVIL,MOTRIN) 200 MG tablet Take 400 mg by mouth every 6 (six) hours as needed for moderate pain.    Marland Kitchen doxycycline (VIBRAMYCIN) 100 MG capsule Take 1 capsule (100 mg total) by mouth 2 (two) times daily. (Patient not taking: Reported on 03/03/2018) 20 capsule 0  . 0.9 %  sodium chloride infusion      No facility-administered medications prior to visit.       Review of Systems  Constitutional: Negative for activity change, appetite change, fatigue and fever.  HENT: Positive for congestion, ear pain, postnasal drip, rhinorrhea, sinus pressure and sinus pain. Negative for nosebleeds, sore throat and trouble swallowing.   Respiratory: Positive for cough, chest tightness, shortness of breath and wheezing. Negative for  apnea, hemoptysis, choking and stridor.   Cardiovascular: Positive for chest pain.  Gastrointestinal: Negative.  Negative for heartburn.  Musculoskeletal: Negative.  Negative for myalgias.  Skin: Negative.  Negative for rash.  Neurological: Positive for headaches.  Hematological: Negative.        Objective:   Physical Exam  Vitals:   04/02/18 0910  BP: 127/84  Pulse: 63  Resp: 16  Temp: 98 F (36.7 C)  TempSrc: Oral  SpO2: 98%  Weight: 188 lb 9.6 oz (85.5 kg)    Gen: Pleasant, well-nourished, in no distress,  normal affect, intermittent cough during interviw  ENT: No lesions,  Bilateral nasal purulence, mouth clear,  oropharynx clear, 3+postnasal drip  Neck: No JVD, no TMG, no carotid bruits  Lungs: No use of accessory muscles, no dullness to percussion, few expiratory wheezes at the bases  Cardiovascular: RRR, heart sounds normal, no murmur or gallops, no peripheral edema  Abdomen: soft and NT, no HSM,  BS normal  Musculoskeletal: No deformities, no cyanosis or clubbing  Neuro: alert, non focal  Skin: Warm, no lesions or rashes  No results found.  12/29 CXR: NAD  Trop 0.0 ECG normal NSR  No acute changes BMP Latest Ref Rng & Units 03/03/2018 02/07/2016  Glucose 70 - 99 mg/dL 103(H) 109(H)  BUN 6 - 20 mg/dL 9 13  Creatinine 0.61 - 1.24 mg/dL 0.87 0.91  Sodium 135 - 145 mmol/L 140 140  Potassium 3.5 - 5.1 mmol/L 4.0 3.6  Chloride 98 - 111 mmol/L 107 105  CO2 22 - 32 mmol/L 25 27  Calcium 8.9 - 10.3 mg/dL 8.8(L) 8.8   CBC Latest Ref Rng & Units 03/03/2018  WBC 4.0 - 10.5 K/uL 6.8  Hemoglobin 13.0 - 17.0 g/dL 15.6  Hematocrit 39.0 - 52.0 % 47.4  Platelets 150 - 400 K/uL 227       Assessment & Plan:  I personally reviewed all images and lab data in the [facility] system as well as any outside material available during this office visit and agree with the  radiology impressions.   Acute bronchitis Acute tracheobronchitis with resultant chronic cough syndrome  with associated airway obstruction and sinusitis with postnasal drip  Plan Prednisone 40 mg daily for 5 days Azithromycin 250 mg to take 2 once and then 1 daily thereafter Albuterol 2 puffs every 6 hours as needed Benzonatate 200 mg 3 times daily as needed for cough along with over-the-counter Delsym Fluticasone 2 sprays each nostril daily The patient  was instructed to finish his current course of omeprazole  Acute non-recurrent maxillary sinusitis Acute maxillary sinusitis with postnasal drip syndrome  Review bronchitis assessment  Cough present for greater than 3 weeks Chronic recurrent cough with sinusitis and bronchitis as precipitating factors I doubt reflux disease is a major factor  Review bronchitis assessment   Joenathan was seen today for hospitalization follow-up.  Diagnoses and all orders for this visit:  Cough present for greater than 3 weeks  Acute bronchitis, unspecified organism  Acute non-recurrent maxillary sinusitis  Other orders -     albuterol (PROVENTIL HFA;VENTOLIN HFA) 108 (90 Base) MCG/ACT inhaler; Inhale 2 puffs into the lungs every 4 (four) hours as needed for wheezing or shortness of breath. -     azithromycin (ZITHROMAX) 250 MG tablet; Take two once then one daily until gone -     predniSONE (DELTASONE) 10 MG tablet; Take 4 tablets daily for 5 days then stop -     fluticasone (FLONASE) 50 MCG/ACT nasal spray; Place 2 sprays into both nostrils daily. -     benzonatate (TESSALON) 200 MG capsule; Take 1 capsule (200 mg total) by mouth 3 (three) times daily as needed for cough.

## 2018-04-02 ENCOUNTER — Encounter: Payer: Self-pay | Admitting: Critical Care Medicine

## 2018-04-02 ENCOUNTER — Ambulatory Visit: Payer: Self-pay | Attending: Critical Care Medicine | Admitting: Critical Care Medicine

## 2018-04-02 ENCOUNTER — Other Ambulatory Visit: Payer: Self-pay

## 2018-04-02 VITALS — BP 127/84 | HR 63 | Temp 98.0°F | Resp 16 | Wt 188.6 lb

## 2018-04-02 DIAGNOSIS — K219 Gastro-esophageal reflux disease without esophagitis: Secondary | ICD-10-CM | POA: Insufficient documentation

## 2018-04-02 DIAGNOSIS — Z79899 Other long term (current) drug therapy: Secondary | ICD-10-CM | POA: Insufficient documentation

## 2018-04-02 DIAGNOSIS — Z885 Allergy status to narcotic agent status: Secondary | ICD-10-CM | POA: Insufficient documentation

## 2018-04-02 DIAGNOSIS — J209 Acute bronchitis, unspecified: Secondary | ICD-10-CM | POA: Insufficient documentation

## 2018-04-02 DIAGNOSIS — R05 Cough: Secondary | ICD-10-CM

## 2018-04-02 DIAGNOSIS — J302 Other seasonal allergic rhinitis: Secondary | ICD-10-CM | POA: Insufficient documentation

## 2018-04-02 DIAGNOSIS — J0100 Acute maxillary sinusitis, unspecified: Secondary | ICD-10-CM | POA: Insufficient documentation

## 2018-04-02 DIAGNOSIS — R058 Other specified cough: Secondary | ICD-10-CM | POA: Insufficient documentation

## 2018-04-02 DIAGNOSIS — Z8249 Family history of ischemic heart disease and other diseases of the circulatory system: Secondary | ICD-10-CM | POA: Insufficient documentation

## 2018-04-02 MED ORDER — ALBUTEROL SULFATE HFA 108 (90 BASE) MCG/ACT IN AERS: 2.0000 | INHALATION_SPRAY | RESPIRATORY_TRACT | 1 refills | Status: DC | PRN

## 2018-04-02 MED ORDER — AZITHROMYCIN 250 MG PO TABS: ORAL_TABLET | ORAL | 0 refills | Status: DC

## 2018-04-02 MED ORDER — BENZONATATE 200 MG PO CAPS: 200.0000 mg | ORAL_CAPSULE | Freq: Three times a day (TID) | ORAL | 0 refills | Status: DC | PRN

## 2018-04-02 MED ORDER — PREDNISONE 10 MG PO TABS: ORAL_TABLET | ORAL | 0 refills | Status: DC

## 2018-04-02 MED ORDER — FLUTICASONE PROPIONATE 50 MCG/ACT NA SUSP: 2.0000 | Freq: Every day | NASAL | 1 refills | Status: DC

## 2018-04-02 MED FILL — BENZONATATE 100 MG CAP: 100 | 10 days supply | Qty: 60 | Fill #0

## 2018-04-02 MED FILL — AZITHROMYCIN 250 MG TABLET: 250 | 5 days supply | Qty: 6 | Fill #0

## 2018-04-02 MED FILL — FLUTICASONE PROP 50 MCG SPR: 50 | 30 days supply | Qty: 16 | Fill #0

## 2018-04-02 MED FILL — predniSONE 10 MG TABS: 10 | 5 days supply | Qty: 20 | Fill #0

## 2018-04-02 MED FILL — !VENTOLIN HFA INHALER: 108 (90 BAS | 16 days supply | Qty: 18 | Fill #0

## 2018-04-02 NOTE — Assessment & Plan Note (Signed)
Acute tracheobronchitis with resultant chronic cough syndrome with associated airway obstruction and sinusitis with postnasal drip  Plan Prednisone 40 mg daily for 5 days Azithromycin 250 mg to take 2 once and then 1 daily thereafter Albuterol 2 puffs every 6 hours as needed Benzonatate 200 mg 3 times daily as needed for cough along with over-the-counter Delsym Fluticasone 2 sprays each nostril daily The patient was instructed to finish his current course of omeprazole

## 2018-04-02 NOTE — Assessment & Plan Note (Signed)
Chronic recurrent cough with sinusitis and bronchitis as precipitating factors I doubt reflux disease is a major factor  Review bronchitis assessment

## 2018-04-02 NOTE — Progress Notes (Signed)
Hospital f/u - 03/04/2019 Continuous cough- occasionally productive Taking OTC medication Wheezes at night

## 2018-04-02 NOTE — Patient Instructions (Signed)
Begin azithromycin 250 mg take 2 once and then 1 daily thereafter till complete  Begin prednisone 10 mg take 4 daily for 5 days then discontinue  Finish your omeprazole that you currently are taking until complete  Use benzonatate 200 mg 3 times a day as needed for cough suppression and you may also use over-the-counter Delsym as needed  Begin albuterol 2 puffs every 6 hours as needed for shortness of breath and chest tightness with coughing spells  Begin fluticasone nasal spray 2 sprays each nostril daily you may discontinue that after the current bottle runs out  Return for recheck in 2 weeks with Dr. Joya Gaskins  Apply for financial assistance so that we can continue to assist you here and establish you for primary care

## 2018-04-02 NOTE — Assessment & Plan Note (Signed)
Acute maxillary sinusitis with postnasal drip syndrome  Review bronchitis assessment

## 2018-04-16 ENCOUNTER — Ambulatory Visit: Payer: Self-pay | Attending: Critical Care Medicine | Admitting: Critical Care Medicine

## 2018-04-16 ENCOUNTER — Other Ambulatory Visit: Payer: Self-pay

## 2018-04-16 ENCOUNTER — Encounter: Payer: Self-pay | Admitting: Critical Care Medicine

## 2018-04-16 VITALS — BP 121/86 | HR 57 | Temp 97.8°F | Resp 16 | Wt 194.0 lb

## 2018-04-16 DIAGNOSIS — J209 Acute bronchitis, unspecified: Secondary | ICD-10-CM | POA: Insufficient documentation

## 2018-04-16 DIAGNOSIS — Z1159 Encounter for screening for other viral diseases: Secondary | ICD-10-CM

## 2018-04-16 DIAGNOSIS — J0100 Acute maxillary sinusitis, unspecified: Secondary | ICD-10-CM | POA: Insufficient documentation

## 2018-04-16 DIAGNOSIS — Z79899 Other long term (current) drug therapy: Secondary | ICD-10-CM | POA: Insufficient documentation

## 2018-04-16 DIAGNOSIS — R058 Other specified cough: Secondary | ICD-10-CM

## 2018-04-16 DIAGNOSIS — Z114 Encounter for screening for human immunodeficiency virus [HIV]: Secondary | ICD-10-CM | POA: Insufficient documentation

## 2018-04-16 DIAGNOSIS — Z885 Allergy status to narcotic agent status: Secondary | ICD-10-CM | POA: Insufficient documentation

## 2018-04-16 DIAGNOSIS — Z23 Encounter for immunization: Secondary | ICD-10-CM | POA: Insufficient documentation

## 2018-04-16 DIAGNOSIS — R05 Cough: Secondary | ICD-10-CM

## 2018-04-16 NOTE — Assessment & Plan Note (Signed)
Acute nonrecurrent maxillary sinusitis now resolved  The patient will continue Flonase on a daily basis

## 2018-04-16 NOTE — Patient Instructions (Signed)
You can continue use benzonatate as needed for cough Use albuterol as needed for shortness of breath or wheezing No additional antibiotics or prednisone indicated We will obtain an HIV and hepatitis C antibody screen today We administered a tetanus vaccine today  We will establish a primary care visit for you in the next 4 to 6 weeks at this clinic

## 2018-04-16 NOTE — Progress Notes (Signed)
Follow up: feels a lot better than the last time

## 2018-04-16 NOTE — Assessment & Plan Note (Signed)
Acute tracheobronchitis now resolved  No further antibiotics or prednisone indicated

## 2018-04-16 NOTE — Progress Notes (Signed)
Subjective:    Patient ID: Peter Archer, male    DOB: 1965/07/17, 53 y.o.   MRN: [id number]  52 y.o.M seen 12/29 at ED for post viral cough All testing neg.  Also recent distal left middle finger fracture rx per ortho.  Patient was seen then a week ago for persistent cough and I felt the patient had sinusitis and bronchitis.  He is received a course of antibiotics and pulse prednisone.  Overall the patient's markedly improved now.  The cough now is gone.  He is less short of breath.  He is no longer wheezing.  See cough assessment below  Note the patient needs a tetanus vaccine and hepatitis C and HIV screening  The patient would like to transfer his primary care to our office  Cough  This is a new problem. The current episode started 1 to 4 weeks ago. The problem has been rapidly improving. The cough is productive of sputum and productive of purulent sputum. Associated symptoms include nasal congestion, postnasal drip and rhinorrhea. Pertinent negatives include no chest pain, ear congestion, ear pain, fever, headaches, heartburn, hemoptysis, myalgias, rash, sore throat, shortness of breath or wheezing. Associated symptoms comments: Sinus pressure , throat is dry in AM when first wake up. The symptoms are aggravated by cold air, other, lying down and fumes. He has tried nothing for the symptoms. There is no history of asthma, bronchitis, COPD or pneumonia.   Past Medical History:  Diagnosis Date  . 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 Allergy   . Inguinal hernia    left   . Umbilical hernia      Family History  Problem Relation Age of Onset  . Heart disease Father   . Stroke Paternal Grandmother   . Cancer Paternal Grandmother        colon  . Colon cancer Neg Hx   . Colon polyps Neg Hx      Social History   Socioeconomic History  . Marital status: Divorced   Spouse name: Not on file  . Number of children: Not on file  . Years of education: Not on file  . Highest education level: Not on file  Occupational History  . Not on file  Social Needs  . Financial resource strain: Not on file  . Food insecurity:    Worry: Not on file    Inability: Not on file  . Transportation needs:    Medical: Not on file    Non-medical: Not on file  Tobacco Use  . Smoking status: Never Smoker  . Smokeless tobacco: Never Used  Substance and Sexual Activity  . Alcohol use: No  . Drug use: No  . Sexual activity: Yes  Lifestyle  . Physical activity:    Days per week: Not on file    Minutes per session: Not on file  . Stress: Not on file  Relationships  . Social connections:    Talks on phone: Not on file    Gets together: Not on file    Attends religious service: Not on file    Active member of club or organization: Not on file    Attends meetings of clubs or organizations: Not on file    Relationship status: Not on file  . Intimate partner violence:    Fear of current or ex partner: Not on  file    Emotionally abused: Not on file    Physically abused: Not on file    Forced sexual activity: Not on file  Other Topics Concern  . Not on file  Social History Narrative  . Not on file     Allergies  Allergen Reactions  . Codeine Anaphylaxis     Outpatient Medications Prior to Visit  Medication Sig Dispense Refill  . albuterol (PROVENTIL HFA;VENTOLIN HFA) 108 (90 Base) MCG/ACT inhaler Inhale 2 puffs into the lungs every 4 (four) hours as needed for wheezing or shortness of breath. 1 Inhaler 1  . benzonatate (TESSALON) 100 MG capsule Take 1 capsule by mouth 3 (three) times daily as needed.    . fluticasone (FLONASE) 50 MCG/ACT nasal spray Place 2 sprays into both nostrils daily. 16 g 1  . ibuprofen (ADVIL,MOTRIN) 200 MG tablet Take 400 mg by mouth every 6 (six) hours as needed for moderate pain.    . Multiple Vitamins-Minerals (ADULT ONE DAILY GUMMIES  PO) Take 2 each by mouth 2 (two) times a week.    Marland Kitchen omeprazole (PRILOSEC) 40 MG capsule Take 1 capsule (40 mg total) by mouth daily. 30 capsule 0  . azithromycin (ZITHROMAX) 250 MG tablet Take two once then one daily until gone (Patient not taking: Reported on 04/16/2018) 6 tablet 0  . benzonatate (TESSALON) 200 MG capsule Take 1 capsule (200 mg total) by mouth 3 (three) times daily as needed for cough. 30 capsule 0  . predniSONE (DELTASONE) 10 MG tablet Take 4 tablets daily for 5 days then stop (Patient not taking: Reported on 04/16/2018) 20 tablet 0   No facility-administered medications prior to visit.       Review of Systems  Constitutional: Negative for activity change, appetite change, fatigue and fever.  HENT: Positive for congestion, postnasal drip, rhinorrhea, sinus pressure and sinus pain. Negative for ear pain, nosebleeds, sore throat and trouble swallowing.   Respiratory: Positive for cough and chest tightness. Negative for apnea, hemoptysis, choking, shortness of breath, wheezing and stridor.   Cardiovascular: Negative for chest pain.  Gastrointestinal: Negative.  Negative for heartburn.  Musculoskeletal: Negative.  Negative for myalgias.  Skin: Negative.  Negative for rash.  Neurological: Negative for headaches.  Hematological: Negative.        Objective:   Physical Exam  Vitals:   04/16/18 0833  BP: 121/86  Pulse: (!) 57  Resp: 16  Temp: 97.8 F (36.6 C)  TempSrc: Oral  SpO2: 99%  Weight: 194 lb (88 kg)    Gen: Pleasant, well-nourished, in no distress,  normal affect, intermittent cough during interviw  ENT: No lesions,  Bilateral nasal purulence has resolved, mouth clear,  oropharynx clear, no   postnasal drip  Neck: No JVD, no TMG, no carotid bruits  Lungs: No use of accessory muscles, no dullness to percussion, clear to auscultation  Cardiovascular: RRR, heart sounds normal, no murmur or gallops, no peripheral edema  Abdomen: soft and NT, no HSM,  BS  normal  Musculoskeletal: No deformities, no cyanosis or clubbing  Neuro: alert, non focal  Skin: Warm, no lesions or rashes  No results found.  12/29 CXR: NAD  Trop 0.0 ECG normal NSR  No acute changes BMP Latest Ref Rng & Units 03/03/2018 02/07/2016  Glucose 70 - 99 mg/dL 103(H) 109(H)  BUN 6 - 20 mg/dL 9 13  Creatinine 0.61 - 1.24 mg/dL 0.87 0.91  Sodium 135 - 145 mmol/L 140 140  Potassium 3.5 - 5.1 mmol/L 4.0  3.6  Chloride 98 - 111 mmol/L 107 105  CO2 22 - 32 mmol/L 25 27  Calcium 8.9 - 10.3 mg/dL 8.8(L) 8.8   CBC Latest Ref Rng & Units 03/03/2018  WBC 4.0 - 10.5 K/uL 6.8  Hemoglobin 13.0 - 17.0 g/dL 15.6  Hematocrit 39.0 - 52.0 % 47.4  Platelets 150 - 400 K/uL 227       Assessment & Plan:  I personally reviewed all images and lab data in the [facility] system as well as any outside material available during this office visit and agree with the  radiology impressions.   Acute non-recurrent maxillary sinusitis Acute nonrecurrent maxillary sinusitis now resolved  The patient will continue Flonase on a daily basis  Acute bronchitis Acute tracheobronchitis now resolved  No further antibiotics or prednisone indicated  Cough present for greater than 3 weeks Chronic cough was due to sinusitis and bronchitis now improving  Continue Flonase and as needed albuterol no additional antibiotics or steroids   Jayceon was seen today for follow-up.  Diagnoses and all orders for this visit:  Acute non-recurrent maxillary sinusitis  Encounter for screening for HIV -     HIV antibody (with reflex)  Need for hepatitis C screening test -     Hepatitis C antibody  Acute bronchitis, unspecified organism  Cough present for greater than 3 weeks   Note an HIV and hepatitis C studies were obtained and also tetanus vaccine were administered

## 2018-04-16 NOTE — Assessment & Plan Note (Signed)
Chronic cough was due to sinusitis and bronchitis now improving  Continue Flonase and as needed albuterol no additional antibiotics or steroids

## 2018-04-17 LAB — HEPATITIS C ANTIBODY: Hep C Virus Ab: <0.1 s/co ratio (ref 0.0–0.9)

## 2018-04-17 LAB — HIV ANTIBODY (ROUTINE TESTING W REFLEX): HIV Screen 4th Generation wRfx: NONREACTIVE

## 2018-04-25 ENCOUNTER — Encounter: Payer: Self-pay | Admitting: *Deleted

## 2018-05-20 ENCOUNTER — Ambulatory Visit: Payer: Self-pay | Admitting: Internal Medicine

## 2019-02-03 ENCOUNTER — Encounter: Payer: Self-pay | Admitting: Gastroenterology

## 2019-03-05 ENCOUNTER — Other Ambulatory Visit: Payer: Self-pay

## 2019-03-05 ENCOUNTER — Encounter: Payer: Self-pay | Admitting: Gastroenterology

## 2019-03-05 ENCOUNTER — Ambulatory Visit (INDEPENDENT_AMBULATORY_CARE_PROVIDER_SITE_OTHER): Payer: BC Managed Care – PPO | Admitting: Family Medicine

## 2019-03-05 ENCOUNTER — Encounter: Payer: Self-pay | Admitting: Family Medicine

## 2019-03-05 VITALS — BP 126/80 | HR 86 | Resp 12 | Ht 68.0 in | Wt 190.1 lb

## 2019-03-05 DIAGNOSIS — Z130 Encounter for screening for diseases of the blood and blood-forming organs and certain disorders involving the immune mechanism: Secondary | ICD-10-CM

## 2019-03-05 DIAGNOSIS — Z125 Encounter for screening for malignant neoplasm of prostate: Secondary | ICD-10-CM | POA: Diagnosis not present

## 2019-03-05 DIAGNOSIS — Z1329 Encounter for screening for other suspected endocrine disorder: Secondary | ICD-10-CM

## 2019-03-05 DIAGNOSIS — Z0000 Encounter for general adult medical examination without abnormal findings: Secondary | ICD-10-CM

## 2019-03-05 DIAGNOSIS — Z13228 Encounter for screening for other metabolic disorders: Secondary | ICD-10-CM | POA: Diagnosis not present

## 2019-03-05 DIAGNOSIS — Z13220 Encounter for screening for lipoid disorders: Secondary | ICD-10-CM | POA: Diagnosis not present

## 2019-03-05 DIAGNOSIS — N529 Male erectile dysfunction, unspecified: Secondary | ICD-10-CM | POA: Diagnosis not present

## 2019-03-05 LAB — BASIC METABOLIC PANEL
BUN: 12 mg/dL (ref 6–23)
CO2: 28 mEq/L (ref 19–32)
Calcium: 9.1 mg/dL (ref 8.4–10.5)
Chloride: 104 mEq/L (ref 96–112)
Creatinine, Ser: 0.9 mg/dL (ref 0.40–1.50)
GFR: 87.6 mL/min (ref >60.00)
Glucose, Bld: 98 mg/dL (ref 70–99)
Potassium: 4 mEq/L (ref 3.5–5.1)
Sodium: 141 mEq/L (ref 135–145)

## 2019-03-05 LAB — PSA: PSA: 2.47 ng/mL (ref 0.10–4.00)

## 2019-03-05 LAB — LIPID PANEL
Cholesterol: 214 mg/dL — ABNORMAL HIGH (ref 0–200)
HDL: 44.8 mg/dL (ref >39.00)
LDL Cholesterol: 143 mg/dL — ABNORMAL HIGH (ref 0–99)
NonHDL: 169.33
Total CHOL/HDL Ratio: 5
Triglycerides: 130 mg/dL (ref 0.0–149.0)
VLDL: 26 mg/dL (ref 0.0–40.0)

## 2019-03-05 LAB — TESTOSTERONE: Testosterone: 363.71 ng/dL (ref 300.00–890.00)

## 2019-03-05 LAB — HEMOGLOBIN A1C: Hgb A1c MFr Bld: 5.6 % (ref 4.6–6.5)

## 2019-03-05 LAB — TSH: TSH: 1.38 u[IU]/mL (ref 0.35–4.50)

## 2019-03-05 MED ORDER — SILDENAFIL CITRATE 20 MG PO TABS: 40.0000 mg | ORAL_TABLET | Freq: Every day | ORAL | 1 refills | Status: DC | PRN

## 2019-03-05 NOTE — Progress Notes (Signed)
HPI:  Peter Archer is a 53 y.o.male here today for his routine physical examination.  Last CPE: Over a year ago. He lives with his fiance. Last seen on 02/07/16. No new problem since his last visit.  Regular exercise 3 or more times per week: Not regularly but he has a very active job. Following a healthy diet:In general he does. Mainly eating at home.   Chronic medical problems: GERD and allergic rhinitis.  Hx of STD's: negative.  Immunization History  Administered Date(s) Administered  . Influenza-Unspecified 11/04/2017  . Tdap 04/16/2018    -Hep C screening: 04/16/18  Last colon cancer screening: 02/14/16, 3 years follow up was recommended. He received reminder letter already. Last prostate ca screening: Not done before. Nocturia x 0. No changes in urinary frequency.  -Denies high alcohol intake, tobacco use, or Hx of illicit drug use.  -Concerns and/or follow up today:   2 years of ED, stable. Sometimes he has morning erections. Denies depression or fatigue. "Low" libido but not all the time.  He has not identified exacerbating or alleviating factors. He has not tried medication.  Review of Systems  Constitutional: Negative for activity change, appetite change, chills, fever and unexpected weight change.  HENT: Negative for dental problem, nosebleeds, sore throat, trouble swallowing and voice change.   Eyes: Negative for redness and visual disturbance.  Respiratory: Negative for cough, shortness of breath and wheezing.   Cardiovascular: Negative for chest pain, palpitations and leg swelling.  Gastrointestinal: Negative for abdominal pain, blood in stool, nausea and vomiting.  Endocrine: Negative for cold intolerance, heat intolerance, polydipsia, polyphagia and polyuria.  Genitourinary: Negative for decreased urine volume, dysuria, genital sores, hematuria and testicular pain.  Musculoskeletal: Negative for gait problem and myalgias.  Skin:  Negative for rash and wound.  Allergic/Immunologic: Positive for environmental allergies.  Neurological: Negative for syncope, weakness and headaches.  Hematological: Negative for adenopathy. Does not bruise/bleed easily.  Psychiatric/Behavioral: Negative for confusion and sleep disturbance. The patient is not nervous/anxious.   All other systems reviewed and are negative.  Current Outpatient Medications on File Prior to Visit  Medication Sig Dispense Refill  . albuterol (PROVENTIL HFA;VENTOLIN HFA) 108 (90 Base) MCG/ACT inhaler Inhale 2 puffs into the lungs every 4 (four) hours as needed for wheezing or shortness of breath. 1 Inhaler 1  . fluticasone (FLONASE) 50 MCG/ACT nasal spray Place 2 sprays into both nostrils daily. 16 g 1  . ibuprofen (ADVIL,MOTRIN) 200 MG tablet Take 400 mg by mouth every 6 (six) hours as needed for moderate pain.    . Multiple Vitamins-Minerals (ADULT ONE DAILY GUMMIES PO) Take 2 each by mouth 2 (two) times a week.    Marland Kitchen omeprazole (PRILOSEC) 40 MG capsule Take 1 capsule (40 mg total) by mouth daily. 30 capsule 0   No current facility-administered medications on file prior to visit.     Past Medical History:  Diagnosis Date  . 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 Allergy   . Inguinal hernia    left   . Umbilical hernia     Past Surgical History:  Procedure Laterality Date  . APPENDECTOMY  1987  . DENTAL SURGERY      Allergies  Allergen Reactions  . Codeine Anaphylaxis    Family History  Problem Relation Age of Onset  . Heart  disease Father   . Stroke Paternal Grandmother   . Cancer Paternal Grandmother        colon  . Colon cancer Neg Hx   . Colon polyps Neg Hx     Social History   Socioeconomic History  . Marital status: Divorced    Spouse name: Not on file  . Number of children: Not on file  . Years of education: Not on file    . Highest education level: Not on file  Occupational History  . Not on file  Tobacco Use  . Smoking status: Never Smoker  . Smokeless tobacco: Never Used  Substance and Sexual Activity  . Alcohol use: No  . Drug use: No  . Sexual activity: Yes  Other Topics Concern  . Not on file  Social History Narrative  . Not on file   Social Determinants of Health   Financial Resource Strain:   . Difficulty of Paying Living Expenses: Not on file  Food Insecurity:   . Worried About Charity fundraiser in the Last Year: Not on file  . Ran Out of Food in the Last Year: Not on file  Transportation Needs:   . Lack of Transportation (Medical): Not on file  . Lack of Transportation (Non-Medical): Not on file  Physical Activity:   . Days of Exercise per Week: Not on file  . Minutes of Exercise per Session: Not on file  Stress:   . Feeling of Stress : Not on file  Social Connections:   . Frequency of Communication with Friends and Family: Not on file  . Frequency of Social Gatherings with Friends and Family: Not on file  . Attends Religious Services: Not on file  . Active Member of Clubs or Organizations: Not on file  . Attends Archivist Meetings: Not on file  . Marital Status: Not on file     Vitals:   03/05/19 0943  BP: 126/80  Pulse: 86  Resp: 12  SpO2: 96%   Body mass index is 28.91 kg/m.   Wt Readings from Last 3 Encounters:  03/05/19 190 lb 2 oz (86.2 kg)  04/16/18 194 lb (88 kg)  04/02/18 188 lb 9.6 oz (85.5 kg)   Physical Exam  Nursing note and vitals reviewed. Constitutional: He is oriented to person, place, and time. He appears well-developed. No distress.  HENT:  Head: Atraumatic.  Right Ear: Tympanic membrane, external ear and ear canal normal.  Left Ear: Tympanic membrane, external ear and ear canal normal.  Mouth/Throat: Oropharynx is clear and moist and mucous membranes are normal.  Eyes: Pupils are equal, round, and reactive to light.  Conjunctivae and EOM are normal.  Neck: No tracheal deviation present. No thyromegaly present.  Cardiovascular: Normal rate and regular rhythm.  No murmur heard. Pulses:      Dorsalis pedis pulses are 2+ on the right side and 2+ on the left side.  Respiratory: Effort normal and breath sounds normal. No respiratory distress.  GI: Soft. He exhibits no mass. There is no hepatomegaly. There is no abdominal tenderness. A hernia (umbilical,easy reducible ,not tender) is present.  Genitourinary:    Genitourinary Comments: No concerns.   Musculoskeletal:        General: No tenderness or edema.     Cervical back: Normal range of motion.     Comments: No major deformities appreciated and no signs of synovitis.  Lymphadenopathy:    He has no cervical adenopathy.  Right: No supraclavicular adenopathy present.       Left: No supraclavicular adenopathy present.  Neurological: He is alert and oriented to person, place, and time. He has normal strength. No cranial nerve deficit or sensory deficit. Coordination and gait normal.  Reflex Scores:      Bicep reflexes are 2+ on the right side and 2+ on the left side.      Patellar reflexes are 2+ on the right side and 2+ on the left side. Skin: Skin is warm. No erythema.  Psychiatric: He has a normal mood and affect. Cognition and memory are normal.  Well groomed,good eye contact.   ASSESSMENT AND PLAN:  Mr. Harvir was seen today for annual exam.  Diagnoses and all orders for this visit:  Lab Results  Component Value Date   PSA 2.47 03/05/2019    Lab Results  Component Value Date   CHOL 214 (H) 03/05/2019   HDL 44.80 03/05/2019   LDLCALC 143 (H) 03/05/2019   TRIG 130.0 03/05/2019   CHOLHDL 5 03/05/2019   Lab Results  Component Value Date   HGBA1C 5.6 03/05/2019   Lab Results  Component Value Date   CREATININE 0.90 03/05/2019   BUN 12 03/05/2019   NA 141 03/05/2019   K 4.0 03/05/2019   CL 104 03/05/2019   CO2 28 03/05/2019    Lab Results  Component Value Date   TSH 1.38 03/05/2019   Lab Results  Component Value Date   TESTOSTERONE 363.71 03/05/2019    Routine general medical examination at a health care facility We discussed the importance of regular physical activity and healthy diet for prevention of chronic illness and/or complications. Preventive guidelines reviewed. Vaccination up to date. He needs to arrange appt for colonoscopy. Next CPE in a year.  The 10-year ASCVD risk score Mikey Bussing DC Brooke Bonito., et al., 2013) is: 6.5%   Values used to calculate the score:     Age: 55 years     Sex: Male     Is Non-Hispanic African American: No     Diabetic: No     Tobacco smoker: No     Systolic Blood Pressure: 123XX123 mmHg     Is BP treated: No     HDL Cholesterol: 44.8 mg/dL     Total Cholesterol: 214 mg/dL  Prostate cancer screening -     PSA(Must document that pt has been informed of limitations of PSA testing.)  Screening for lipoid disorders -     Lipid panel  Screening for endocrine, metabolic and immunity disorder -     Basic metabolic panel -     Hemoglobin A1c  Erectile dysfunction, unspecified erectile dysfunction type Discussed dx,prognosis,and treatment options. Some side effects of medication reviewed, he also understands that med may not be covered by insurance. Further recommendations according to lab results.  -     sildenafil (REVATIO) 20 MG tablet; Take 2 tablets (40 mg total) by mouth daily as needed. -     TSH -     Testosterone   Return in 1 year (on 03/04/2020).    Miasia Crabtree G. Martinique, MD  [facility]. [place] office.

## 2019-03-05 NOTE — Patient Instructions (Addendum)
A few things to remember from today's visit:   Routine general medical examination at a health care facility  Prostate cancer screening - Plan: PSA(Must document that pt has been informed of limitations of PSA testing.)  Screening for lipoid disorders - Plan: Lipid panel  Screening for endocrine, metabolic and immunity disorder - Plan: Basic metabolic panel, Hemoglobin A1c  Erectile dysfunction, unspecified erectile dysfunction type - Plan: sildenafil (REVATIO) 20 MG tablet, TSH, Testosterone  At least 150 minutes of moderate exercise per week, daily brisk walking for 15-30 min is a good exercise option. Healthy diet low in saturated (animal) fats and sweets and consisting of fresh fruits and vegetables, lean meats such as fish and white chicken and whole grains.  - Vaccines:  Tdap vaccine every 10 years.  Shingles vaccine recommended at age 67, could be given after 55 years of age but not sure about insurance coverage.  Pneumonia vaccines:  Prevnar 22 at 32 and Pneumovax at 25.   -Screening recommendations for low/normal risk males:  Screening for diabetes at age 65 and every 3 years. Earlier screening if cardiovascular risk factors.   Lipid screening at 35 and every 3 years. Screening starts in younger males with cardiovascular risk factors.  Colon cancer screening at age 42 and until age 37.  Prostate cancer screening: some controversy, starts usually at 70: Rectal exam and PSA.  Aortic Abdominal Aneurism once between 30 and 42 years old if ever smoker.  Also recommended:  1. Dental visit- Brush and floss your teeth twice daily; visit your dentist twice a year. 2. Eye doctor- Get an eye exam at least every 2 years. 3. Helmet use- Always wear a helmet when riding a bicycle, motorcycle, rollerblading or skateboarding. 4. Safe sex- If you may be exposed to sexually transmitted infections, use a condom. 5. Seat belts- Seat belts can save your live; always wear  one. 6. Smoke/Carbon Monoxide detectors- These detectors need to be installed on the appropriate level of your home. Replace batteries at least once a year. 7. Skin cancer- When out in the sun please cover up and use sunscreen 15 SPF or higher. 8. Violence- If anyone is threatening or hurting you, please tell your healthcare provider.  9. Drink alcohol in moderation- Limit alcohol intake to one drink or less per day. Never drink and drive.   Please be sure medication list is accurate. If a new problem present, please set up appointment sooner than planned today.

## 2019-03-24 ENCOUNTER — Other Ambulatory Visit: Payer: Self-pay

## 2019-03-24 ENCOUNTER — Ambulatory Visit (AMBULATORY_SURGERY_CENTER): Payer: Self-pay | Admitting: *Deleted

## 2019-03-24 VITALS — Temp 97.6°F | Ht 68.0 in | Wt 198.0 lb

## 2019-03-24 DIAGNOSIS — Z86010 Personal history of colonic polyps: Secondary | ICD-10-CM

## 2019-03-24 MED ORDER — NA SULFATE-K SULFATE-MG SULF 17.5-3.13-1.6 GM/177ML PO SOLN: 1.0000 | Freq: Once | ORAL | 0 refills | Status: AC

## 2019-03-24 NOTE — Progress Notes (Signed)

## 2019-03-31 ENCOUNTER — Other Ambulatory Visit: Payer: Self-pay | Admitting: Family Medicine

## 2019-03-31 DIAGNOSIS — N529 Male erectile dysfunction, unspecified: Secondary | ICD-10-CM

## 2019-04-01 ENCOUNTER — Encounter: Payer: Self-pay | Admitting: Gastroenterology

## 2019-04-03 ENCOUNTER — Encounter: Payer: Self-pay | Admitting: Gastroenterology

## 2019-04-07 ENCOUNTER — Encounter: Payer: Self-pay | Admitting: Gastroenterology

## 2019-04-07 ENCOUNTER — Ambulatory Visit (AMBULATORY_SURGERY_CENTER): Payer: BC Managed Care – PPO | Admitting: Gastroenterology

## 2019-04-07 ENCOUNTER — Other Ambulatory Visit: Payer: Self-pay

## 2019-04-07 VITALS — BP 109/72 | HR 54 | Temp 97.1°F | Resp 15 | Ht 68.0 in | Wt 198.0 lb

## 2019-04-07 DIAGNOSIS — D124 Benign neoplasm of descending colon: Secondary | ICD-10-CM

## 2019-04-07 DIAGNOSIS — D125 Benign neoplasm of sigmoid colon: Secondary | ICD-10-CM | POA: Diagnosis not present

## 2019-04-07 DIAGNOSIS — Z86010 Personal history of colonic polyps: Secondary | ICD-10-CM

## 2019-04-07 DIAGNOSIS — D120 Benign neoplasm of cecum: Secondary | ICD-10-CM | POA: Diagnosis not present

## 2019-04-07 DIAGNOSIS — D122 Benign neoplasm of ascending colon: Secondary | ICD-10-CM | POA: Diagnosis not present

## 2019-04-07 HISTORY — PX: COLONOSCOPY: SHX174

## 2019-04-07 MED ORDER — SODIUM CHLORIDE 0.9 % IV SOLN: 500.0000 mL | Freq: Once | INTRAVENOUS | Status: DC

## 2019-04-07 NOTE — Patient Instructions (Signed)
HANDOUTS PROVIDED ON: POLYPS  The polyps removed today have been sent for pathology.  The results can take 1-3 weeks to receive.  When your next colonoscopy should occur will be based on the pathology results.    You may resume your previous diet and medication schedule.  Thank you for allowing us to care for you today!!!  YOU HAD AN ENDOSCOPIC PROCEDURE TODAY AT THE [facility]:   Refer to the procedure report that was given to you for any specific questions about what was found during the examination.  If the procedure report does not answer your questions, please call your gastroenterologist to clarify.  If you requested that your care partner not be given the details of your procedure findings, then the procedure report has been included in a sealed envelope for you to review at your convenience later.  YOU SHOULD EXPECT: Some feelings of bloating in the abdomen. Passage of more gas than usual.  Walking can help get rid of the air that was put into your GI tract during the procedure and reduce the bloating. If you had a lower endoscopy (such as a colonoscopy or flexible sigmoidoscopy) you may notice spotting of blood in your stool or on the toilet paper. If you underwent a bowel prep for your procedure, you may not have a normal bowel movement for a few days.  Please Note:  You might notice some irritation and congestion in your nose or some drainage.  This is from the oxygen used during your procedure.  There is no need for concern and it should clear up in a day or so.  SYMPTOMS TO REPORT IMMEDIATELY:   Following lower endoscopy (colonoscopy or flexible sigmoidoscopy):  Excessive amounts of blood in the stool  Significant tenderness or worsening of abdominal pains  Swelling of the abdomen that is new, acute  Fever of 100F or higher  For urgent or emergent issues, a gastroenterologist can be reached at any hour by calling [phone].   DIET:  We do recommend a small  meal at first, but then you may proceed to your regular diet.  Drink plenty of fluids but you should avoid alcoholic beverages for 24 hours.  ACTIVITY:  You should plan to take it easy for the rest of today and you should NOT DRIVE or use heavy machinery until tomorrow (because of the sedation medicines used during the test).    FOLLOW UP: Our staff will call the number listed on your records 48-72 hours following your procedure to check on you and address any questions or concerns that you may have regarding the information given to you following your procedure. If we do not reach you, we will leave a message.  We will attempt to reach you two times.  During this call, we will ask if you have developed any symptoms of COVID 19. If you develop any symptoms (ie: fever, flu-like symptoms, shortness of breath, cough etc.) before then, please call [phone].  If you test positive for Covid 19 in the 2 weeks post procedure, please call and report this information to us.    If any biopsies were taken you will be contacted by phone or by letter within the next 1-3 weeks.  Please call us at [phone] if you have not heard about the biopsies in 3 weeks.    SIGNATURES/CONFIDENTIALITY: You and/or your care partner have signed paperwork which will be entered into your electronic medical record.  These signatures attest to the   fact that that the information above on your After Visit Summary has been reviewed and is understood.  Full responsibility of the confidentiality of this discharge information lies with you and/or your care-partner.   

## 2019-04-07 NOTE — Progress Notes (Signed)
Report given to PACU, vss 

## 2019-04-07 NOTE — Progress Notes (Signed)
Called to room to assist during endoscopic procedure.  Patient ID and intended procedure confirmed with present staff. Received instructions for my participation in the procedure from the performing physician.  

## 2019-04-07 NOTE — Op Note (Signed)
[place] Patient Name: Peter Archer Procedure Date: 04/07/2019 9:05 AM MRN: [id number] Endoscopist: Milus Banister , MD Age: 54 Referring MD:  Date of Birth: Mar 11, 1965 Gender: Male Account #: [id number] Procedure:                Colonoscopy Indications:              High risk colon cancer surveillance: Personal                            history of colonic polyps; colonoscopy 2017 three                            subCM adenomas Medicines:                Monitored Anesthesia Care Procedure:                Pre-Anesthesia Assessment:                           - Prior to the procedure, a History and Physical                            was performed, and patient medications and                            allergies were reviewed. The patient's tolerance of                            previous anesthesia was also reviewed. The risks                            and benefits of the procedure and the sedation                            options and risks were discussed with the patient.                            All questions were answered, and informed consent                            was obtained. Prior Anticoagulants: The patient has                            taken no previous anticoagulant or antiplatelet                            agents. ASA Grade Assessment: II - A patient with                            mild systemic disease. After reviewing the risks                            and benefits, the patient was deemed in  satisfactory condition to undergo the procedure.                           After obtaining informed consent, the colonoscope                            was passed under direct vision. Throughout the                            procedure, the patient's blood pressure, pulse, and                            oxygen saturations were monitored continuously. The                            Colonoscope was introduced through the anus and                           advanced to the the cecum, identified by                            appendiceal orifice and ileocecal valve. The                            colonoscopy was performed without difficulty. The                            patient tolerated the procedure well. The quality                            of the bowel preparation was good. The ileocecal                            valve, appendiceal orifice, and rectum were                            photographed. Scope In: 9:08:08 AM Scope Out: 9:21:58 AM Scope Withdrawal Time: 0 hours 12 minutes 19 seconds  Total Procedure Duration: 0 hours 13 minutes 50 seconds  Findings:                 Three sessile polyps were found in the descending                            colon, ascending colon and cecum. The polyps were 2                            to 4 mm in size. These polyps were removed with a                            cold snare. Resection and retrieval were complete.                           A 11 mm polyp was found in the sigmoid colon. The  polyp was pedunculated. The polyp was removed with                            a hot snare. Resection and retrieval were complete.                           The exam was otherwise without abnormality on                            direct and retroflexion views. Complications:            No immediate complications. Estimated blood loss:                            None. Estimated Blood Loss:     Estimated blood loss: none. Impression:               - Three 2 to 4 mm polyps in the descending colon,                            in the ascending colon and in the cecum, removed                            with a cold snare. Resected and retrieved.                           - One 11 mm polyp in the sigmoid colon, removed                            with a hot snare. Resected and retrieved.                           - The examination was otherwise normal on direct                             and retroflexion views. Recommendation:           - Patient has a contact number available for                            emergencies. The signs and symptoms of potential                            delayed complications were discussed with the                            patient. Return to normal activities tomorrow.                            Written discharge instructions were provided to the                            patient.                           -  Resume previous diet.                           - Continue present medications.                           - Await pathology results. Milus Banister, MD 04/07/2019 9:24:17 AM This report has been signed electronically.

## 2019-04-07 NOTE — Progress Notes (Signed)
Pt's states no medical or surgical changes since previsit or office visit.Vitals-CW Temp-JB

## 2019-04-09 ENCOUNTER — Telehealth: Payer: Self-pay

## 2019-04-09 ENCOUNTER — Telehealth: Payer: Self-pay | Admitting: *Deleted

## 2019-04-09 NOTE — Telephone Encounter (Signed)
No answer for post procedure call back. Unable to leave message. 

## 2019-04-09 NOTE — Telephone Encounter (Signed)
Attempted to reach patient for post-procedure f/u call. Unable to leave message as mailbox is full. Staff will make another attempt to reach him again later today.

## 2019-04-11 ENCOUNTER — Encounter: Payer: Self-pay | Admitting: Gastroenterology

## 2019-07-23 ENCOUNTER — Encounter: Payer: Self-pay | Admitting: Family Medicine

## 2019-07-23 ENCOUNTER — Other Ambulatory Visit: Payer: Self-pay

## 2019-07-23 ENCOUNTER — Ambulatory Visit (INDEPENDENT_AMBULATORY_CARE_PROVIDER_SITE_OTHER): Payer: BC Managed Care – PPO | Admitting: Family Medicine

## 2019-07-23 VITALS — BP 122/86 | HR 92 | Temp 98.0°F | Resp 12 | Ht 68.0 in | Wt 184.1 lb

## 2019-07-23 DIAGNOSIS — H6121 Impacted cerumen, right ear: Secondary | ICD-10-CM

## 2019-07-23 DIAGNOSIS — H60501 Unspecified acute noninfective otitis externa, right ear: Secondary | ICD-10-CM | POA: Diagnosis not present

## 2019-07-23 DIAGNOSIS — K420 Umbilical hernia with obstruction, without gangrene: Secondary | ICD-10-CM | POA: Diagnosis not present

## 2019-07-23 MED ORDER — DEBROX 6.5 % OT SOLN: 5.0000 [drp] | Freq: Two times a day (BID) | OTIC | 0 refills | Status: DC

## 2019-07-23 MED ORDER — NEOMYCIN-POLYMYXIN-HC 1 % OT SOLN: 3.0000 [drp] | Freq: Four times a day (QID) | OTIC | 0 refills | Status: AC

## 2019-07-23 NOTE — Progress Notes (Signed)
Chief Complaint  Patient presents with  . Hernia pain   HPI: [name] is a 54 y.o. male, who is here today with above concern.  Umbilical hernia for years. States that he was also told that he has a "groin" hernia,he is not sure which side. No pain,testicular pain/edema, or bulged areas. Intermittent umbilical pain, it is getting worse. Exacerbated by lifting, which he does frequently at work.  Denies nausea, vomiting, changes in bowel habits, blood in stool or melena. No urinary symptoms.  Right earache for a few days, getting better. Exacerbated by earplugs and pulling ear.  Negative for recent URI or travel. Negative for fever,ear drainage,or sore throat. Mild rhinorrhea and nasal congestion,caused by allergies and not more than usual.  He is also concerned about a "cyst in my abdomen." States that a few years ago he has abdominal imaging done and was told he had a cyst. He did not receive recommendations in regard to follow up.  In 02/2016 he had a abdominal US due to epigastric and RUQ pain. No gallstones or sonographic evidence of acute cholecystitis. If there are clinical concerns of chronic cholecystitis, a nuclear medicine hepatobiliary scan with gallbladder ejection fraction determination may be useful. Normal appearance of the liver and common bile duct.  Abdominal US 02/22/09 due to abdominal pain:Normal right upper quadrant ultrasound examination. Specifically, no  gallstones identified.  Review of Systems  Constitutional: Negative for activity change, appetite change and fatigue.  HENT: Negative for mouth sores and nosebleeds.   Respiratory: Negative for cough, shortness of breath and wheezing.   Genitourinary: Negative for decreased urine volume, dysuria and hematuria.  Musculoskeletal: Negative for gait problem and myalgias.  Skin: Negative for rash and wound.  Allergic/Immunologic: Positive for environmental allergies.  Neurological:  Negative for syncope, weakness and headaches.  Rest see pertinent positives and negatives per HPI.  Current Outpatient Medications on File Prior to Visit  Medication Sig Dispense Refill  . ibuprofen (ADVIL,MOTRIN) 200 MG tablet Take 400 mg by mouth every 6 (six) hours as needed for moderate pain.    . Multiple Vitamins-Minerals (ADULT ONE DAILY GUMMIES PO) Take 2 each by mouth 2 (two) times a week.    . sildenafil (REVATIO) 20 MG tablet TAKE 2 TABLETS BY MOUTH DAILY AS NEEDED. 60 tablet 1  . fluticasone (FLONASE) 50 MCG/ACT nasal spray Place 2 sprays into both nostrils daily. (Patient not taking: Reported on 07/23/2019) 16 g 1  . omeprazole (PRILOSEC) 40 MG capsule Take 1 capsule (40 mg total) by mouth daily. 30 capsule 0   No current facility-administered medications on file prior to visit.   Past Medical History:  Diagnosis Date  . 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 Allergy   . Inguinal hernia    left   . Umbilical hernia    Allergies  Allergen Reactions  . Codeine Anaphylaxis    Social History   Socioeconomic History  . Marital status: Divorced    Spouse name: Not on file  . Number of children: Not on file  . Years of education: Not on file  . Highest education level: Not on file  Occupational History  . Not on file  Tobacco Use  . Smoking status: Never Smoker  . Smokeless tobacco: Never Used  Substance and Sexual Activity  . Alcohol use: No  .  Drug use: No  . Sexual activity: Yes  Other Topics Concern  . Not on file  Social History Narrative  . Not on file   Social Determinants of Health   Financial Resource Strain:   . Difficulty of Paying Living Expenses:   Food Insecurity:   . Worried About Charity fundraiser in the Last Year:   . Arboriculturist in the Last Year:   Transportation Needs:   . Film/video editor (Medical):   Marland Kitchen Lack of  Transportation (Non-Medical):   Physical Activity:   . Days of Exercise per Week:   . Minutes of Exercise per Session:   Stress:   . Feeling of Stress :   Social Connections:   . Frequency of Communication with Friends and Family:   . Frequency of Social Gatherings with Friends and Family:   . Attends Religious Services:   . Active Member of Clubs or Organizations:   . Attends Archivist Meetings:   Marland Kitchen Marital Status:     Vitals:   07/23/19 1528  BP: 122/86  Pulse: 92  Resp: 12  Temp: 98 F (36.7 C)  SpO2: 92%   Body mass index is 28 kg/m.  Physical Exam  Nursing note and vitals reviewed. Constitutional: He is oriented to person, place, and time. He appears well-developed. No distress.  HENT:  Head: Normocephalic and atraumatic.  Right Ear: External ear normal.  Left Ear: Tympanic membrane, external ear and ear canal normal.  Mouth/Throat: Oropharynx is clear and moist and mucous membranes are normal.  Right ear canal excess cerumen.TM seen partially. Minimal erythema,no edema. Tenderness when pressing tragus and during otoscopic exam.  Eyes: Conjunctivae are normal.  Cardiovascular: Normal rate and regular rhythm.  No murmur heard. Respiratory: Effort normal and breath sounds normal. No respiratory distress.  GI: Soft. He exhibits no mass. There is abdominal tenderness in the periumbilical area. There is no rigidity, no rebound and no guarding. A hernia is present.  Umbilical hernia is reducible, mildly tender.  Genitourinary:    Genitourinary Comments: Refused exam.   Musculoskeletal:        General: No edema.  Lymphadenopathy:       Head (right side): No preauricular and no posterior auricular adenopathy present.    He has no cervical adenopathy.  Neurological: He is alert and oriented to person, place, and time. He has normal strength. Gait normal.  Skin: Skin is warm. No erythema.  Psychiatric: He has a normal mood and affect.  Well groomed, good eye  contact.    ASSESSMENT AND PLAN:  Peter Archer was seen today for hernia pain.  Diagnoses and all orders for this visit:  Acute otitis externa of right ear, unspecified type Improving. Topical abx recommended. Instructed to avoid earplugs and ear phones. F/U as needed.  -     NEOMYCIN-POLYMYXIN-HYDROCORTISONE (CORTISPORIN) 1 % SOLN OTIC solution; Place 3 drops into the left ear 4 (four) times daily for 7 days.  Umbilical hernia with obstruction, without gangrene Instructed about warning signs. He will follow with surgeon, who can also evaluate for inguinal hernias.  -     Ambulatory referral to General Surgery  Excessive cerumen in ear canal, right Not impacted. Avoid Q tips. Debrox ear drops may help.  -     carbamide peroxide (DEBROX) 6.5 % OTIC solution; Place 5 drops into the left ear 2 (two) times daily.   Return if symptoms worsen or fail to improve.   Inez Catalina  G. Martinique, MD  [facility]. [place] office.  Discharge Instructions   None

## 2019-07-24 NOTE — Patient Instructions (Signed)
Epic technical problems, so AVS could not been printed.

## 2019-09-22 ENCOUNTER — Other Ambulatory Visit (HOSPITAL_COMMUNITY): Payer: BC Managed Care – PPO

## 2019-09-22 ENCOUNTER — Ambulatory Visit: Payer: Self-pay | Admitting: General Surgery

## 2019-09-25 ENCOUNTER — Ambulatory Visit: Admit: 2019-09-25 | Payer: BC Managed Care – PPO | Admitting: General Surgery

## 2019-09-25 SURGERY — REPAIR, HERNIA, INGUINAL, LAPAROSCOPIC
Anesthesia: General

## 2019-11-24 ENCOUNTER — Encounter (HOSPITAL_BASED_OUTPATIENT_CLINIC_OR_DEPARTMENT_OTHER): Payer: Self-pay | Admitting: General Surgery

## 2019-11-24 ENCOUNTER — Other Ambulatory Visit (HOSPITAL_COMMUNITY): Payer: BC Managed Care – PPO

## 2019-11-24 ENCOUNTER — Other Ambulatory Visit (HOSPITAL_COMMUNITY)
Admission: RE | Admit: 2019-11-24 | Discharge: 2019-11-24 | Disposition: A | Discharge status: Home / Self Care | Payer: BC Managed Care – PPO | Source: Ambulatory Visit | Attending: General Surgery | Admitting: General Surgery

## 2019-11-24 ENCOUNTER — Other Ambulatory Visit: Payer: Self-pay

## 2019-11-24 DIAGNOSIS — Z01812 Encounter for preprocedural laboratory examination: Secondary | ICD-10-CM | POA: Diagnosis present

## 2019-11-24 DIAGNOSIS — Z20822 Contact with and (suspected) exposure to covid-19: Secondary | ICD-10-CM | POA: Diagnosis not present

## 2019-11-24 LAB — SARS CORONAVIRUS 2 (TAT 6-24 HRS): SARS Coronavirus 2: NEGATIVE

## 2019-11-24 NOTE — Progress Notes (Signed)
Spoke w/ via phone for pre-op interview--- PT Lab needs dos---- none               Lab results------ no COVID test ------ 11-24-2019 @ 1100 Arrive at ------- 0830 NPO after MN NO Solid Food.  Clear liquids from MN until--- 0730 Medications to take morning of surgery ----- Prilosec  Diabetic medication ----- n/a Patient Special Instructions ----- n/a Pre-Op special Istructions ----- n/a Patient verbalized understanding of instructions that were given at this phone interview. Patient denies shortness of breath, chest pain, fever, cough at this phone interview.

## 2019-11-25 ENCOUNTER — Encounter: Payer: Self-pay | Admitting: Family Medicine

## 2019-11-25 NOTE — Telephone Encounter (Signed)
Okay to send in 40 mg omeprazole?

## 2019-11-27 ENCOUNTER — Ambulatory Visit (HOSPITAL_COMMUNITY)
Admission: RE | Admit: 2019-11-27 | Discharge: 2019-11-27 | Disposition: A | Discharge status: Home / Self Care | Payer: BC Managed Care – PPO | Attending: General Surgery | Admitting: General Surgery

## 2019-11-27 ENCOUNTER — Ambulatory Visit (HOSPITAL_BASED_OUTPATIENT_CLINIC_OR_DEPARTMENT_OTHER): Payer: BC Managed Care – PPO | Admitting: Anesthesiology

## 2019-11-27 ENCOUNTER — Encounter (HOSPITAL_BASED_OUTPATIENT_CLINIC_OR_DEPARTMENT_OTHER)
Admission: RE | Disposition: A | Discharge status: Home / Self Care | Payer: Self-pay | Source: Home / Self Care | Attending: General Surgery

## 2019-11-27 ENCOUNTER — Encounter (HOSPITAL_BASED_OUTPATIENT_CLINIC_OR_DEPARTMENT_OTHER): Payer: Self-pay | Admitting: General Surgery

## 2019-11-27 DIAGNOSIS — K4020 Bilateral inguinal hernia, without obstruction or gangrene, not specified as recurrent: Secondary | ICD-10-CM | POA: Insufficient documentation

## 2019-11-27 DIAGNOSIS — K429 Umbilical hernia without obstruction or gangrene: Secondary | ICD-10-CM | POA: Insufficient documentation

## 2019-11-27 DIAGNOSIS — Z8616 Personal history of COVID-19: Secondary | ICD-10-CM | POA: Insufficient documentation

## 2019-11-27 DIAGNOSIS — K219 Gastro-esophageal reflux disease without esophagitis: Secondary | ICD-10-CM | POA: Insufficient documentation

## 2019-11-27 DIAGNOSIS — Z823 Family history of stroke: Secondary | ICD-10-CM | POA: Diagnosis not present

## 2019-11-27 DIAGNOSIS — Z800 Family history of malignant neoplasm of digestive organs: Secondary | ICD-10-CM | POA: Diagnosis not present

## 2019-11-27 DIAGNOSIS — Z791 Long term (current) use of non-steroidal anti-inflammatories (NSAID): Secondary | ICD-10-CM | POA: Insufficient documentation

## 2019-11-27 DIAGNOSIS — Z885 Allergy status to narcotic agent status: Secondary | ICD-10-CM | POA: Insufficient documentation

## 2019-11-27 DIAGNOSIS — Z8249 Family history of ischemic heart disease and other diseases of the circulatory system: Secondary | ICD-10-CM | POA: Diagnosis not present

## 2019-11-27 DIAGNOSIS — Z79899 Other long term (current) drug therapy: Secondary | ICD-10-CM | POA: Diagnosis not present

## 2019-11-27 HISTORY — PX: UMBILICAL HERNIA REPAIR: SHX196

## 2019-11-27 HISTORY — PX: INGUINAL HERNIA REPAIR: SHX194

## 2019-11-27 HISTORY — DX: Personal history of COVID-19: Z86.16

## 2019-11-27 HISTORY — DX: Other allergic rhinitis: J30.89

## 2019-11-27 HISTORY — DX: Bilateral inguinal hernia, without obstruction or gangrene, not specified as recurrent: K40.20

## 2019-11-27 HISTORY — DX: Gastro-esophageal reflux disease without esophagitis: K21.9

## 2019-11-27 SURGERY — REPAIR, HERNIA, INGUINAL, LAPAROSCOPIC
Anesthesia: General | Site: Abdomen

## 2019-11-27 MED ORDER — GABAPENTIN 300 MG PO CAPS
300.0000 mg | ORAL_CAPSULE | ORAL | Status: AC
  Administered 2019-11-27: 300 mg via ORAL

## 2019-11-27 MED ORDER — OXYCODONE HCL 5 MG PO TABS: 5.0000 mg | ORAL_TABLET | Freq: Four times a day (QID) | ORAL | 0 refills | Status: DC | PRN

## 2019-11-27 MED ORDER — MIDAZOLAM HCL 2 MG/2ML IJ SOLN
INTRAMUSCULAR | Status: AC
  Filled 2019-11-27: qty 2

## 2019-11-27 MED ORDER — FENTANYL CITRATE (PF) 100 MCG/2ML IJ SOLN: 25.0000 ug | INTRAMUSCULAR | Status: DC | PRN

## 2019-11-27 MED ORDER — PROPOFOL 10 MG/ML IV BOLUS
INTRAVENOUS | Status: AC
  Filled 2019-11-27: qty 20

## 2019-11-27 MED ORDER — KETAMINE HCL 10 MG/ML IJ SOLN
INTRAMUSCULAR | Status: DC | PRN
  Administered 2019-11-27: 30 mg via INTRAVENOUS

## 2019-11-27 MED ORDER — KETOROLAC TROMETHAMINE 15 MG/ML IJ SOLN: 15.0000 mg | INTRAMUSCULAR | Status: DC

## 2019-11-27 MED ORDER — CHLORHEXIDINE GLUCONATE CLOTH 2 % EX PADS: 6.0000 | MEDICATED_PAD | Freq: Once | CUTANEOUS | Status: DC

## 2019-11-27 MED ORDER — LACTATED RINGERS IV SOLN: INTRAVENOUS | Status: DC

## 2019-11-27 MED ORDER — ONDANSETRON HCL 4 MG/2ML IJ SOLN
INTRAMUSCULAR | Status: DC | PRN
  Administered 2019-11-27: 4 mg via INTRAVENOUS

## 2019-11-27 MED ORDER — LIDOCAINE 20MG/ML (2%) 15 ML SYRINGE OPTIME
INTRAMUSCULAR | Status: DC | PRN
  Administered 2019-11-27: 1.5 mg/kg/h via INTRAVENOUS

## 2019-11-27 MED ORDER — ONDANSETRON HCL 4 MG/2ML IJ SOLN
INTRAMUSCULAR | Status: AC
  Filled 2019-11-27: qty 2

## 2019-11-27 MED ORDER — ACETAMINOPHEN 325 MG PO TABS: 325.0000 mg | ORAL_TABLET | ORAL | Status: DC | PRN

## 2019-11-27 MED ORDER — ROCURONIUM BROMIDE 10 MG/ML (PF) SYRINGE
PREFILLED_SYRINGE | INTRAVENOUS | Status: AC
  Filled 2019-11-27: qty 10

## 2019-11-27 MED ORDER — PROPOFOL 10 MG/ML IV BOLUS
INTRAVENOUS | Status: DC | PRN
  Administered 2019-11-27: 180 mg via INTRAVENOUS

## 2019-11-27 MED ORDER — DEXAMETHASONE SODIUM PHOSPHATE 10 MG/ML IJ SOLN
INTRAMUSCULAR | Status: DC | PRN
  Administered 2019-11-27: 10 mg via INTRAVENOUS

## 2019-11-27 MED ORDER — MIDAZOLAM HCL 5 MG/5ML IJ SOLN
INTRAMUSCULAR | Status: DC | PRN
  Administered 2019-11-27: 2 mg via INTRAVENOUS

## 2019-11-27 MED ORDER — BUPIVACAINE HCL 0.5 % IJ SOLN
INTRAMUSCULAR | Status: DC | PRN
  Administered 2019-11-27: 30 mL

## 2019-11-27 MED ORDER — BUPIVACAINE LIPOSOME 1.3 % IJ SUSP: 20.0000 mL | Freq: Once | INTRAMUSCULAR | Status: DC

## 2019-11-27 MED ORDER — PROMETHAZINE HCL 25 MG/ML IJ SOLN: 6.2500 mg | INTRAMUSCULAR | Status: DC | PRN

## 2019-11-27 MED ORDER — KETOROLAC TROMETHAMINE 30 MG/ML IJ SOLN: 30.0000 mg | Freq: Once | INTRAMUSCULAR | Status: DC | PRN

## 2019-11-27 MED ORDER — MEPERIDINE HCL 25 MG/ML IJ SOLN: 6.2500 mg | INTRAMUSCULAR | Status: DC | PRN

## 2019-11-27 MED ORDER — CEFAZOLIN SODIUM-DEXTROSE 2-4 GM/100ML-% IV SOLN
2.0000 g | INTRAVENOUS | Status: AC
  Administered 2019-11-27: 2 g via INTRAVENOUS

## 2019-11-27 MED ORDER — BUPIVACAINE LIPOSOME 1.3 % IJ SUSP
INTRAMUSCULAR | Status: DC | PRN
  Administered 2019-11-27: 20 mL

## 2019-11-27 MED ORDER — KETAMINE HCL 10 MG/ML IJ SOLN
INTRAMUSCULAR | Status: AC
  Filled 2019-11-27: qty 1

## 2019-11-27 MED ORDER — SUGAMMADEX SODIUM 200 MG/2ML IV SOLN
INTRAVENOUS | Status: DC | PRN
  Administered 2019-11-27: 200 mg via INTRAVENOUS

## 2019-11-27 MED ORDER — IBUPROFEN 800 MG PO TABS: 800.0000 mg | ORAL_TABLET | Freq: Three times a day (TID) | ORAL | 0 refills | Status: DC | PRN

## 2019-11-27 MED ORDER — GABAPENTIN 300 MG PO CAPS
ORAL_CAPSULE | ORAL | Status: AC
  Filled 2019-11-27: qty 1

## 2019-11-27 MED ORDER — ENSURE PRE-SURGERY PO LIQD: 296.0000 mL | Freq: Once | ORAL | Status: DC

## 2019-11-27 MED ORDER — LIDOCAINE 2% (20 MG/ML) 5 ML SYRINGE
INTRAMUSCULAR | Status: AC
  Filled 2019-11-27: qty 5

## 2019-11-27 MED ORDER — FENTANYL CITRATE (PF) 100 MCG/2ML IJ SOLN
INTRAMUSCULAR | Status: DC | PRN
Start: 2019-11-27 — End: 2019-11-27
  Administered 2019-11-27 (×2): 50 ug via INTRAVENOUS
  Administered 2019-11-27: 100 ug via INTRAVENOUS

## 2019-11-27 MED ORDER — LIDOCAINE 2% (20 MG/ML) 5 ML SYRINGE
INTRAMUSCULAR | Status: DC | PRN
  Administered 2019-11-27: 80 mg via INTRAVENOUS

## 2019-11-27 MED ORDER — ACETAMINOPHEN 500 MG PO TABS
ORAL_TABLET | ORAL | Status: AC
  Filled 2019-11-27: qty 2

## 2019-11-27 MED ORDER — DEXAMETHASONE SODIUM PHOSPHATE 10 MG/ML IJ SOLN
INTRAMUSCULAR | Status: AC
  Filled 2019-11-27: qty 1

## 2019-11-27 MED ORDER — ROCURONIUM BROMIDE 10 MG/ML (PF) SYRINGE
PREFILLED_SYRINGE | INTRAVENOUS | Status: DC | PRN
  Administered 2019-11-27: 70 mg via INTRAVENOUS
  Administered 2019-11-27: 10 mg via INTRAVENOUS

## 2019-11-27 MED ORDER — FENTANYL CITRATE (PF) 250 MCG/5ML IJ SOLN
INTRAMUSCULAR | Status: AC
Start: 2019-11-27 — End: ?
  Filled 2019-11-27: qty 5

## 2019-11-27 MED ORDER — ACETAMINOPHEN 500 MG PO TABS
1000.0000 mg | ORAL_TABLET | ORAL | Status: AC
  Administered 2019-11-27: 1000 mg via ORAL

## 2019-11-27 MED ORDER — CEFAZOLIN SODIUM-DEXTROSE 2-4 GM/100ML-% IV SOLN
INTRAVENOUS | Status: AC
  Filled 2019-11-27: qty 100

## 2019-11-27 MED ORDER — ACETAMINOPHEN 160 MG/5ML PO SOLN: 325.0000 mg | ORAL | Status: DC | PRN

## 2019-11-27 SURGICAL SUPPLY — 64 items
ADH SKN CLS APL DERMABOND .7 (GAUZE/BANDAGES/DRESSINGS) ×2
APL PRP STRL LF DISP 70% ISPRP (MISCELLANEOUS) ×2
BLADE CLIPPER SENSICLIP SURGIC (BLADE) ×3 IMPLANT
BLADE HEX COATED 2.75 (ELECTRODE) ×3 IMPLANT
BLADE SURG 15 STRL LF DISP TIS (BLADE) ×2 IMPLANT
BLADE SURG 15 STRL SS (BLADE) ×3
CABLE HIGH FREQUENCY MONO STRZ (ELECTRODE) ×3 IMPLANT
CANISTER SUCT 3000ML PPV (MISCELLANEOUS) IMPLANT
CELLS DAT CNTRL 66122 CELL SVR (MISCELLANEOUS) IMPLANT
CHLORAPREP W/TINT 26 (MISCELLANEOUS) ×3 IMPLANT
COVER BACK TABLE 60X90IN (DRAPES) ×3 IMPLANT
COVER MAYO STAND STRL (DRAPES) ×3 IMPLANT
COVER WAND RF STERILE (DRAPES) ×3 IMPLANT
DECANTER SPIKE VIAL GLASS SM (MISCELLANEOUS) ×3 IMPLANT
DERMABOND ADVANCED (GAUZE/BANDAGES/DRESSINGS) ×1
DERMABOND ADVANCED .7 DNX12 (GAUZE/BANDAGES/DRESSINGS) ×2 IMPLANT
DEVICE SECURE STRAP 25 ABSORB (INSTRUMENTS) ×3 IMPLANT
DRAPE LAPAROSCOPIC ABDOMINAL (DRAPES) ×3 IMPLANT
DRAPE UTILITY XL STRL (DRAPES) ×3 IMPLANT
DRSG COVADERM PLUS 2X2 (GAUZE/BANDAGES/DRESSINGS) IMPLANT
ELECT REM PT RETURN 9FT ADLT (ELECTROSURGICAL) ×3
ELECTRODE REM PT RTRN 9FT ADLT (ELECTROSURGICAL) ×2 IMPLANT
GLOVE BIOGEL PI IND STRL 7.0 (GLOVE) ×2 IMPLANT
GLOVE BIOGEL PI INDICATOR 7.0 (GLOVE) ×1
GLOVE SURG SS PI 7.0 STRL IVOR (GLOVE) ×3 IMPLANT
GOWN STRL REUS W/TWL LRG LVL3 (GOWN DISPOSABLE) ×6 IMPLANT
GRASPER SUT TROCAR 14GX15 (MISCELLANEOUS) IMPLANT
IRRIG SUCT STRYKERFLOW 2 WTIP (MISCELLANEOUS) ×3
IRRIGATION SUCT STRKRFLW 2 WTP (MISCELLANEOUS) ×2 IMPLANT
KIT TURNOVER CYSTO (KITS) ×3 IMPLANT
MESH 3DMAX 4X6 LT LRG (Mesh General) ×3 IMPLANT
MESH 3DMAX 4X6 RT LRG (Mesh General) ×3 IMPLANT
NEEDLE HYPO 25X1 1.5 SAFETY (NEEDLE) ×3 IMPLANT
NS IRRIG 500ML POUR BTL (IV SOLUTION) ×3 IMPLANT
PACK BASIN DAY SURGERY FS (CUSTOM PROCEDURE TRAY) ×3 IMPLANT
PENCIL SMOKE EVACUATOR (MISCELLANEOUS) ×3 IMPLANT
RTRCTR WOUND ALEXIS 18CM MED (MISCELLANEOUS)
RTRCTR WOUND ALEXIS 18CM SML (INSTRUMENTS)
SAVER CELL AAL HAEMONETICS (INSTRUMENTS) IMPLANT
SCISSORS LAP 5X35 DISP (ENDOMECHANICALS) IMPLANT
SET TUBE SMOKE EVAC HIGH FLOW (TUBING) ×3 IMPLANT
SHEARS HARMONIC ACE PLUS 36CM (ENDOMECHANICALS) IMPLANT
SPONGE LAP 4X18 RFD (DISPOSABLE) ×3 IMPLANT
SUT MNCRL AB 4-0 PS2 18 (SUTURE) ×3 IMPLANT
SUT NOVA NAB GS-21 0 18 T12 DT (SUTURE) ×3 IMPLANT
SUT PDS AB 0 CT1 36 (SUTURE) IMPLANT
SUT PROLENE 0 CT 1 CR/8 (SUTURE) IMPLANT
SUT VIC AB 0 SH 27 (SUTURE) IMPLANT
SUT VIC AB 2-0 SH 27 (SUTURE)
SUT VIC AB 2-0 SH 27XBRD (SUTURE) IMPLANT
SUT VIC AB 3-0 SH 27 (SUTURE) ×3
SUT VIC AB 3-0 SH 27X BRD (SUTURE) ×2 IMPLANT
SUT VICRYL 0 UR6 27IN ABS (SUTURE) IMPLANT
SUT VLOC BARB 180 ABS3/0GR12 (SUTURE)
SUT VLOC NONABS 0 BL6 GS-21 (SUTURE) ×3 IMPLANT
SUTURE VLOC BRB 180 ABS3/0GR12 (SUTURE) IMPLANT
SYR BULB IRRIG 60ML STRL (SYRINGE) ×3 IMPLANT
SYR CONTROL 10ML LL (SYRINGE) ×3 IMPLANT
TOWEL OR 17X26 10 PK STRL BLUE (TOWEL DISPOSABLE) ×6 IMPLANT
TRAY LAPAROSCOPIC (CUSTOM PROCEDURE TRAY) ×3 IMPLANT
TROCAR BLADELESS OPT 12M 100M (ENDOMECHANICALS) ×3 IMPLANT
TROCAR BLADELESS OPT 5 100 (ENDOMECHANICALS) ×6 IMPLANT
TUBE CONNECTING 12X1/4 (SUCTIONS) ×3 IMPLANT
YANKAUER SUCT BULB TIP NO VENT (SUCTIONS) ×3 IMPLANT

## 2019-11-27 NOTE — Anesthesia Preprocedure Evaluation (Signed)
Anesthesia Evaluation  Patient identified by MRN, date of birth, ID band Patient awake    Reviewed: Allergy & Precautions, NPO status , Patient's Chart, lab work & pertinent test results  Airway Mallampati: I       Dental no notable dental hx.    Pulmonary neg pulmonary ROS,    Pulmonary exam normal        Cardiovascular negative cardio ROS Normal cardiovascular exam     Neuro/Psych negative neurological ROS  negative psych ROS   GI/Hepatic Neg liver ROS, GERD  Medicated and Controlled,  Endo/Other  negative endocrine ROS  Renal/GU negative Renal ROS  negative genitourinary   Musculoskeletal negative musculoskeletal ROS (+)   Abdominal Normal abdominal exam  (+)   Peds negative pediatric ROS (+)  Hematology negative hematology ROS (+)   Anesthesia Other Findings   Reproductive/Obstetrics negative OB ROS                             Anesthesia Physical Anesthesia Plan  ASA: II  Anesthesia Plan: General   Post-op Pain Management:    Induction: Intravenous  PONV Risk Score and Plan: 4 or greater and Ondansetron, Dexamethasone and Midazolam  Airway Management Planned: Oral ETT  Additional Equipment: None  Intra-op Plan:   Post-operative Plan: Extubation in OR  Informed Consent: I have reviewed the patients History and Physical, chart, labs and discussed the procedure including the risks, benefits and alternatives for the proposed anesthesia with the patient or authorized representative who has indicated his/her understanding and acceptance.     Dental advisory given  Plan Discussed with:   Anesthesia Plan Comments:         Anesthesia Quick Evaluation

## 2019-11-27 NOTE — Anesthesia Postprocedure Evaluation (Signed)
Anesthesia Post Note  Patient: Peter Archer  Procedure(s) Performed: LAPAROSCOPIC INGUINAL HERNIA WITH MESH (Bilateral Abdomen) OPEN HERNIA REPAIR UMBILICAL WITH MESH (N/A Abdomen)     Patient location during evaluation: PACU Anesthesia Type: General Level of consciousness: awake and sedated Pain management: pain level controlled Vital Signs Assessment: post-procedure vital signs reviewed and stable Respiratory status: spontaneous breathing Cardiovascular status: stable Postop Assessment: no apparent nausea or vomiting Anesthetic complications: no   No complications documented.  Last Vitals:  Vitals:   11/27/19 1300 11/27/19 1315  BP: (!) 148/106 (!) 135/107  Pulse: 77 78  Resp: 10 12  Temp:    SpO2: 100% 100%    Last Pain:  Vitals:   11/27/19 1243  TempSrc:   PainSc: Asleep   Pain Goal: Patients Stated Pain Goal: 6 (11/27/19 0900)                 Huston Foley

## 2019-11-27 NOTE — Discharge Instructions (Signed)
Laparoscopic Inguinal Hernia Repair, Adult, Care After This sheet gives you information about how to care for yourself after your procedure. Your health care provider may also give you more specific instructions. If you have problems or questions, contact your health care provider. What can I expect after the procedure? After the procedure, it is common to have:  Pain.  Swelling and bruising around the incision area.  Scrotal swelling, in men.  Some fluid or blood draining from your incisions. Follow these instructions at home: Incision care  Follow instructions from your health care provider about how to take care of your incisions. Make sure you: ? Wash your hands with soap and water before you change your bandage (dressing). If soap and water are not available, use hand sanitizer. ? Change your dressing as told by your health care provider. ? Leave stitches (sutures), skin glue, or adhesive strips in place. These skin closures may need to stay in place for 2 weeks or longer. If adhesive strip edges start to loosen and curl up, you may trim the loose edges. Do not remove adhesive strips completely unless your health care provider tells you to do that.  Check your incision area every day for signs of infection. Check for: ? More redness, swelling, or pain. ? More fluid or blood. ? Warmth. ? Pus or a bad smell.  Wear loose, soft clothing while your incisions heal. Driving  Do not drive or use heavy machinery while taking prescription pain medicine.  Do not drive for 24 hours if you were given a medicine to help you relax (sedative) during your procedure. Activity  Do not lift anything that is heavier than 10 lb (4.5 kg), or the limit that you are told, until your health care provider says that it is safe.  Ask your health care provider what activities are safe for you. A lot of activity during the first week after surgery can increase pain and swelling. For 1 week after your  procedure: ? Avoid activities that take a lot of effort, such as exercise or sports. ? You may walk and climb stairs as needed for daily activity, but avoid long walks or climbing stairs for exercise. Managing pain and swelling   Put ice on painful or swollen areas: ? Put ice in a plastic bag. ? Place a towel between your skin and the bag. ? Leave the ice on for 20 minutes, 2-3 times a day. General instructions  Do not take baths, swim, or use a hot tub until your health care provider approves. Ask your health care provider if you may take showers. You may only be allowed to take sponge baths.  Take over-the-counter and prescription medicines only as told by your health care provider.  To prevent or treat constipation while you are taking prescription pain medicine, your health care provider may recommend that you: ? Drink enough fluid to keep your urine pale yellow. ? Take over-the-counter or prescription medicines. ? Eat foods that are high in fiber, such as fresh fruits and vegetables, whole grains, and beans. ? Limit foods that are high in fat and processed sugars, such as fried and sweet foods.  Do not use any products that contain nicotine or tobacco, such as cigarettes and e-cigarettes. If you need help quitting, ask your health care provider.  Drink enough fluid to keep your urine pale yellow.  Keep all follow-up visits as told by your health care provider. This is important. Contact a health care provider if:    You have more redness, swelling, or pain around your incisions or your groin area.  You have more swelling in your scrotum.  You have more fluid or blood coming from your incisions.  Your incisions feel warm to the touch.  You have severe pain and medicines do not help.  You have abdominal pain or swelling.  You cannot eat or drink without vomiting.  You cannot urinate or pass a bowel movement.  You faint.  You feel dizzy.  You have nausea and  vomiting.  You have a fever. Get help right away if:  You have pus or a bad smell coming from your incisions.  You have redness, warmth, or pain in your leg.  You have chest pain.  You have problems breathing. Summary  Pain, swelling, and bruising are common after the procedure.  Check your incision area every day for signs of infection, such as more redness, swelling, or pain.  Put ice on painful or swollen areas for 20 minutes, 2-3 times a day. This information is not intended to replace advice given to you by your health care provider. Make sure you discuss any questions you have with your health care provider. Document Revised: 07/31/2018 Document Reviewed: 06/01/2016 Elsevier Patient Education  [place] for Discharge Teaching: EXPAREL (bupivacaine liposome injectable suspension)   Your surgeon or anesthesiologist gave you EXPAREL(bupivacaine) to help control your pain after surgery.   EXPAREL is a local anesthetic that provides pain relief by numbing the tissue around the surgical site.  EXPAREL is designed to release pain medication over time and can control pain for up to 72 hours.  Depending on how you respond to EXPAREL, you may require less pain medication during your recovery.  Possible side effects:  Temporary loss of sensation or ability to move in the area where bupivacaine was injected.  Nausea, vomiting, constipation  Rarely, numbness and tingling in your mouth or lips, lightheadedness, or anxiety may occur.  Call your doctor right away if you think you may be experiencing any of these sensations, or if you have other questions regarding possible side effects.  Follow all other discharge instructions given to you by your surgeon or nurse. Eat a healthy diet and drink plenty of water or other fluids.  If you return to the hospital for any reason within 96 hours following the administration of EXPAREL, it is important for health care  providers to know that you have received this anesthetic. A teal colored band has been placed on your arm with the date, time and amount of EXPAREL you have received in order to alert and inform your health care providers. Please leave this armband in place for the full 96 hours following administration, and then you may remove the band.  Post Anesthesia Home Care Instructions  Activity: Get plenty of rest for the remainder of the day. A responsible individual must stay with you for 24 hours following the procedure.  For the next 24 hours, DO NOT: -Drive a car -Paediatric nurse -Drink alcoholic beverages -Take any medication unless instructed by your physician -Make any legal decisions or sign important papers.  Meals: Start with liquid foods such as gelatin or soup. Progress to regular foods as tolerated. Avoid greasy, spicy, heavy foods. If nausea and/or vomiting occur, drink only clear liquids until the nausea and/or vomiting subsides. Call your physician if vomiting continues.  Special Instructions/Symptoms: Your throat may feel dry or sore from the anesthesia or the breathing tube placed in  your throat during surgery. If this causes discomfort, gargle with warm salt water. The discomfort should disappear within 24 hours.

## 2019-11-27 NOTE — H&P (Signed)
Peter Archer is an 54 y.o. male.   Chief Complaint: hernias HPI: 54 yo male with bilateral inguinal hernia and small umbilical hernia. He is having inguinal symptoms.  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   COLONOSCOPY  04/2019    Family History  Problem Relation Age of Onset   Heart disease Father    Stroke Paternal Grandmother    Cancer Paternal Grandmother        colon   Colon cancer Neg Hx    Colon polyps Neg Hx    Esophageal cancer Neg Hx    Stomach cancer Neg Hx    Rectal cancer Neg Hx    Social History:  reports that he has never smoked. He has never used smokeless tobacco. He reports current alcohol use. He reports that he does not use drugs.  Allergies:  Allergies  Allergen Reactions   Codeine Anaphylaxis    Medications Prior to Admission  Medication Sig Dispense Refill   ibuprofen (ADVIL,MOTRIN) 200 MG tablet Take 400 mg by mouth every 6 (six) hours as needed for moderate pain.     Multiple Vitamins-Minerals (ADULT ONE DAILY GUMMIES PO) Take 2 each by mouth 2 (two) times a week.     omeprazole (PRILOSEC) 20 MG capsule Take 20 mg by mouth daily.     fluticasone (FLONASE) 50 MCG/ACT nasal spray Place 2 sprays into both nostrils daily. (Patient taking differently: Place 2 sprays into both nostrils as needed. ) 16 g 1   sildenafil (REVATIO) 20 MG tablet TAKE 2 TABLETS BY MOUTH DAILY AS NEEDED. (Patient taking differently: Take 20 mg by mouth as  needed. ) 60 tablet 1    No results found for this or any previous visit (from the past 48 hour(s)). No results found.  Review of Systems  Constitutional: Negative for chills and fever.  HENT: Negative for hearing loss.   Respiratory: Negative for cough.   Cardiovascular: Negative for chest pain and palpitations.  Gastrointestinal: Negative for abdominal pain, nausea and vomiting.  Genitourinary: Negative for dysuria and urgency.  Musculoskeletal: Negative for myalgias and neck pain.  Skin: Negative for rash.  Neurological: Negative for dizziness and headaches.  Hematological: Does not bruise/bleed easily.  Psychiatric/Behavioral: Negative for suicidal ideas.    Blood pressure (!) 117/91, pulse (!) 58, temperature 98 F (36.7 C), temperature source Oral, resp. rate 20, height 5\' 6"  (1.676 m), weight 83.1 kg, SpO2 99 %. Physical Exam Vitals reviewed.  Constitutional:      Appearance: He is well-developed.  HENT:     Head: Normocephalic and atraumatic.  Eyes:     Conjunctiva/sclera: Conjunctivae normal.     Pupils: Pupils are equal, round, and reactive to light.  Cardiovascular:     Rate and Rhythm: Normal rate and regular rhythm.  Pulmonary:     Effort: Pulmonary effort is normal.     Breath sounds: Normal breath sounds.  Abdominal:  General: Bowel sounds are normal. There is no distension.     Palpations: Abdomen is soft.     Tenderness: There is no abdominal tenderness.     Comments: Small inguinal hernias, small umbilical hernia  Musculoskeletal:        General: Normal range of motion.     Cervical back: Normal range of motion and neck supple.  Skin:    General: Skin is warm and dry.  Neurological:     Mental Status: He is alert and oriented to person, place, and time.  Psychiatric:        Behavior: Behavior normal.      Assessment/Plan 54 yo male with multiple hernias -laparoscopic bilateral inguinal hernia repair with mesh -planned outpatient  procedure -ERas protocol  Mickeal Skinner, MD 11/27/2019, 10:32 AM

## 2019-11-27 NOTE — Transfer of Care (Signed)
Immediate Anesthesia Transfer of Care Note  Patient: Peter Archer  Procedure(s) Performed: LAPAROSCOPIC INGUINAL HERNIA WITH MESH (Bilateral Abdomen) OPEN HERNIA REPAIR UMBILICAL WITH MESH (N/A Abdomen)  Patient Location: PACU  Anesthesia Type:General  Level of Consciousness: awake, alert  and oriented  Airway & Oxygen Therapy: Patient Spontanous Breathing and Patient connected to nasal cannula oxygen  Post-op Assessment: Report given to RN and Post -op Vital signs reviewed and stable  Post vital signs: Reviewed and stable  Last Vitals:  Vitals Value Taken Time  BP 149/105 11/27/19 1243  Temp 36.5 C 11/27/19 1243  Pulse 81 11/27/19 1244  Resp 10 11/27/19 1244  SpO2 96 % 11/27/19 1244  Vitals shown include unvalidated device data.  Last Pain:  Vitals:   11/27/19 0900  TempSrc: Oral  PainSc: 0-No pain      Patients Stated Pain Goal: 6 (99/23/41 4436)  Complications: No complications documented.

## 2019-11-27 NOTE — Anesthesia Procedure Notes (Signed)
Procedure Name: Intubation Date/Time: 11/27/2019 10:51 AM Performed by: Louvenia Golomb D, CRNA Pre-anesthesia Checklist: Patient identified, Emergency Drugs available, Suction available and Patient being monitored Patient Re-evaluated:Patient Re-evaluated prior to induction Oxygen Delivery Method: Circle system utilized Preoxygenation: Pre-oxygenation with 100% oxygen Induction Type: IV induction Ventilation: Mask ventilation without difficulty Laryngoscope Size: Mac and 4 Grade View: Grade I Tube type: Oral Tube size: 7.5 mm Number of attempts: 1 Airway Equipment and Method: Stylet Placement Confirmation: ETT inserted through vocal cords under direct vision,  positive ETCO2 and breath sounds checked- equal and bilateral Secured at: 22 cm Tube secured with: Tape Dental Injury: Teeth and Oropharynx as per pre-operative assessment

## 2019-11-27 NOTE — Op Note (Signed)
Preop diagnosis: bilateral inguinal hernia, umbilical hernia  Postop diagnosis: bilateral inguinal hernia, umbilical hernia  Procedure: laparoscopic Bilateral inguinal hernia repair with mesh, open umbilical hernia  Surgeon: Gurney Maxin, M.D.  Asst: none  Anesthesia: Gen.   Indications for procedure: Peter Archer is a 54 y.o. male with symptoms of pain and enlarging Bilateral inguinal hernia(s). After discussing risks, alternatives and benefits he decided on laparoscopic repair and was brought to day surgery for repair.  Description of procedure: The patient was brought into the operative suite, placed supine. Anesthesia was administered with endotracheal tube. Patient was strapped in place. The patient was prepped and draped in the usual sterile fashion.  Next Exparel:Marcaine mix was injected to the left of the umbilicus, and a transverse 2 cm incision was made. Dissection was used to visualize the anterior rectus sheath. Anterior rectus sheath was sharply incised, next to the 12 mm trocar was inserted into the preperitoneal space. Laparoscope was inserted to confirm placement and then used to bluntly dissect the retrorectus space clear. Once the midline ws free 2 4mm trocars were placed. 1 in the suprapubic area and 1in 5cm superior to the first.  On initial visualization, There was a moderate sized indirect left and moderate sized indirect right inguinal hernias. Next I began our dissection on the right identifying the ASIS laterally and then working back medially removing the filmy tissue and adhesions of the peritoneum to the abdominal wall. Hernia sac was completely dissected out of the canal. Vas deference and contents of the cord were safely dissected away of the hernia sac. The Exparel mix was infused along the transversus abdominis planes laterally and near the lacunar ligament medially. There was a tear along the lateral peritoneum that was repaired with a v-loc suture.  Next,  attention was turned to the left side. We began dissection by identifying the ASIS laterally and then working back medially removing the filmy tissue and adhesions of the peritoneum to the abdominal wall. Hernia sac was completely dissected out of the canal. Vas deference and contents of the cord were safely dissected away of the hernia sac. The Exparel mix was infused along the transversus abdominis planes laterally and near the lacunar ligament medially.  A large right 3D max mesh was inserted and tacked medially to the lacunar ligament. A large left 3D max mesh was inserted and tacked medially to the lacunar ligament. The mesh was positioned flat and directly up against the direct and indirect areas. The CO2 was evacuated while watching to ensure the mesh did not migrate.  Next, the umbilical stalk was dissected in 360 degrees and cautery used to separate the hernia from the skin. The hernia was filled with omentum that was reduced. The sac was removed. The subcutaneous tissue was freed from around the fascia. The defect was 15 mm in diameter. 2 0 novafil sutures were used to close the hernia.  The anterior rectus fascia was closed with 0 novafil in interrupted sutures and all skin incisions were closed with 4-0 monocryl subcu stitch. The patient awoke from anesthesia and was brought to PACU in stable condition.  Findings: bilateral indirect inguinal hernia, small umbilical hernia  Specimen: none  Blood loss: 20 ml  Local anesthesia: 50 ml Exparel:Marcaine mix  Complications: none  Implant: right and left large Bard 3D max mesh  Gurney Maxin, M.D. General, Bariatric, & Minimally Invasive Surgery [facility], [place] 12:23 PM 11/27/2019

## 2019-11-28 ENCOUNTER — Encounter (HOSPITAL_BASED_OUTPATIENT_CLINIC_OR_DEPARTMENT_OTHER): Payer: Self-pay | Admitting: General Surgery

## 2020-11-07 ENCOUNTER — Emergency Department (HOSPITAL_BASED_OUTPATIENT_CLINIC_OR_DEPARTMENT_OTHER): Payer: Self-pay | Admitting: Radiology

## 2020-11-07 ENCOUNTER — Other Ambulatory Visit: Payer: Self-pay

## 2020-11-07 ENCOUNTER — Emergency Department (HOSPITAL_BASED_OUTPATIENT_CLINIC_OR_DEPARTMENT_OTHER)
Admission: EM | Admit: 2020-11-07 | Discharge: 2020-11-07 | Disposition: A | Discharge status: Home / Self Care | Payer: Self-pay | Attending: Emergency Medicine | Admitting: Emergency Medicine

## 2020-11-07 ENCOUNTER — Encounter (HOSPITAL_BASED_OUTPATIENT_CLINIC_OR_DEPARTMENT_OTHER): Payer: Self-pay | Admitting: Obstetrics and Gynecology

## 2020-11-07 ENCOUNTER — Emergency Department (HOSPITAL_BASED_OUTPATIENT_CLINIC_OR_DEPARTMENT_OTHER): Payer: Self-pay

## 2020-11-07 DIAGNOSIS — R1012 Left upper quadrant pain: Secondary | ICD-10-CM

## 2020-11-07 DIAGNOSIS — K219 Gastro-esophageal reflux disease without esophagitis: Secondary | ICD-10-CM | POA: Insufficient documentation

## 2020-11-07 DIAGNOSIS — M545 Low back pain, unspecified: Secondary | ICD-10-CM | POA: Insufficient documentation

## 2020-11-07 DIAGNOSIS — Z8616 Personal history of COVID-19: Secondary | ICD-10-CM | POA: Insufficient documentation

## 2020-11-07 DIAGNOSIS — D734 Cyst of spleen: Secondary | ICD-10-CM | POA: Insufficient documentation

## 2020-11-07 LAB — URINALYSIS, ROUTINE W REFLEX MICROSCOPIC
Bilirubin Urine: NEGATIVE
Glucose, UA: NEGATIVE mg/dL
Hgb urine dipstick: NEGATIVE
Ketones, ur: NEGATIVE mg/dL
Leukocytes,Ua: NEGATIVE
Nitrite: NEGATIVE
Specific Gravity, Urine: 1.034 — ABNORMAL HIGH (ref 1.005–1.030)
pH: 5.5 (ref 5.0–8.0)

## 2020-11-07 LAB — CBC
HCT: 45.1 % (ref 39.0–52.0)
Hemoglobin: 15.1 g/dL (ref 13.0–17.0)
MCH: 28.2 pg (ref 26.0–34.0)
MCHC: 33.5 g/dL (ref 30.0–36.0)
MCV: 84.3 fL (ref 80.0–100.0)
Platelets: 251 10*3/uL (ref 150–400)
RBC: 5.35 MIL/uL (ref 4.22–5.81)
RDW: 12.6 % (ref 11.5–15.5)
WBC: 6.2 10*3/uL (ref 4.0–10.5)
nRBC: 0 % (ref 0.0–0.2)

## 2020-11-07 LAB — COMPREHENSIVE METABOLIC PANEL
ALT: 35 U/L (ref 0–44)
AST: 26 U/L (ref 15–41)
Albumin: 4.3 g/dL (ref 3.5–5.0)
Alkaline Phosphatase: 71 U/L (ref 38–126)
Anion gap: 9 (ref 5–15)
BUN: 12 mg/dL (ref 6–20)
CO2: 27 mmol/L (ref 22–32)
Calcium: 8.8 mg/dL — ABNORMAL LOW (ref 8.9–10.3)
Chloride: 104 mmol/L (ref 98–111)
Creatinine, Ser: 0.86 mg/dL (ref 0.61–1.24)
GFR, Estimated: >60 mL/min (ref >60)
Glucose, Bld: 97 mg/dL (ref 70–99)
Potassium: 4 mmol/L (ref 3.5–5.1)
Sodium: 140 mmol/L (ref 135–145)
Total Bilirubin: 1.1 mg/dL (ref 0.3–1.2)
Total Protein: 6.8 g/dL (ref 6.5–8.1)

## 2020-11-07 LAB — LIPASE, BLOOD: Lipase: 40 U/L (ref 11–51)

## 2020-11-07 LAB — TROPONIN I (HIGH SENSITIVITY)
Troponin I (High Sensitivity): 3 ng/L (ref <18)
Troponin I (High Sensitivity): 3 ng/L (ref <18)

## 2020-11-07 MED ORDER — NAPROXEN 500 MG PO TABS: 500.0000 mg | ORAL_TABLET | Freq: Two times a day (BID) | ORAL | 0 refills | Status: DC

## 2020-11-07 MED ORDER — OXYCODONE HCL 5 MG PO TABS: 5.0000 mg | ORAL_TABLET | Freq: Four times a day (QID) | ORAL | 0 refills | Status: DC | PRN

## 2020-11-07 MED ORDER — ONDANSETRON HCL 4 MG/2ML IJ SOLN
4.0000 mg | Freq: Once | INTRAMUSCULAR | Status: AC
  Administered 2020-11-07: 4 mg via INTRAVENOUS
  Filled 2020-11-07: qty 2

## 2020-11-07 MED ORDER — FENTANYL CITRATE PF 50 MCG/ML IJ SOSY
50.0000 ug | PREFILLED_SYRINGE | Freq: Once | INTRAMUSCULAR | Status: AC
  Administered 2020-11-07: 50 ug via INTRAVENOUS
  Filled 2020-11-07: qty 1

## 2020-11-07 MED ORDER — METHOCARBAMOL 500 MG PO TABS: 1000.0000 mg | ORAL_TABLET | Freq: Four times a day (QID) | ORAL | 0 refills | Status: DC

## 2020-11-07 MED ORDER — FENTANYL CITRATE PF 50 MCG/ML IJ SOSY
100.0000 ug | PREFILLED_SYRINGE | Freq: Once | INTRAMUSCULAR | Status: AC
  Administered 2020-11-07: 100 ug via INTRAVENOUS
  Filled 2020-11-07: qty 2

## 2020-11-07 MED ORDER — IOHEXOL 350 MG/ML SOLN
100.0000 mL | Freq: Once | INTRAVENOUS | Status: AC | PRN
  Administered 2020-11-07: 75 mL via INTRAVENOUS

## 2020-11-07 NOTE — ED Triage Notes (Signed)
Patient reports to the ER for chest pain that shoots to his back. Patient reports it is making him nauseated. Patient reports the pain is also radiating to his abdomen as well.

## 2020-11-07 NOTE — ED Provider Notes (Signed)
[place] EMERGENCY DEPT Provider Note   CSN: [id number] Arrival date & time: 11/07/20  1226     History Chief Complaint  Patient presents with   Chest Pain    Peter Archer is a 55 y.o. male.  Patient with history of splenic cyst, previous hernia repair, no cardiac history --presents to the emergency department today with complaint of left lower back pain.  Patient started having this about a week ago.  Initially it was more mild and pain went away.  Yesterday he was completely fine.  This morning he woke up with more severe pain in his lower back, left that is worse with movement and palpation.  Patient has difficulty standing from a sitting position or twisting.  No treatments prior to arrival.  Reports that pain radiates around to his left flank into his abdomen and also into his chest.  No vomiting or diaphoresis.  No stool changes or urinary symptoms.  Pain does not radiate into the legs. The onset of this condition was acute. The course is constant.        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) ago but never had it rechecked - [facility]- told benign    Bilateral inguinal hernia    Environmental and seasonal allergies    GERD (gastroesophageal reflux disease)    History of 2019 novel coronavirus disease (COVID-19)    per pt positive test 10/ 2020 with mild symptoms that resolved in a week   Umbilical hernia     Patient Active Problem List   Diagnosis Date Noted   GERD (gastroesophageal reflux disease) 04/02/2018   Seasonal allergies 04/02/2018   Cough present for greater than 3 weeks 04/02/2018   Closed nondisplaced fracture of distal phalanx of left middle finger 08/28/2017    Past Surgical History:  Procedure Laterality Date   APPENDECTOMY  1985 approx.   COLONOSCOPY  04/2019   INGUINAL HERNIA REPAIR Bilateral 11/27/2019   Procedure: LAPAROSCOPIC  INGUINAL HERNIA WITH MESH;  Surgeon: Kinsinger, Arta Bruce, MD;  Location: [place] [facility];  Service: General;  Laterality: Bilateral;   UMBILICAL HERNIA REPAIR N/A 11/27/2019   Procedure: OPEN HERNIA REPAIR UMBILICAL WITH MESH;  Surgeon: Kinsinger, Arta Bruce, MD;  Location: [facility];  Service: General;  Laterality: N/A;       Family History  Problem Relation Age of Onset   Heart disease Father    Stroke Paternal Grandmother    Cancer Paternal Grandmother        colon   Colon cancer Neg Hx    Colon polyps Neg Hx    Esophageal cancer Neg Hx    Stomach cancer Neg Hx    Rectal cancer Neg Hx     Social History   Tobacco Use   Smoking status: Never   Smokeless tobacco: Never  Vaping Use   Vaping Use: Never used  Substance Use Topics   Alcohol use: Yes    Comment: occasional   Drug use: Never    Home Medications Prior to Admission medications   Medication Sig Start Date End Date Taking? Authorizing Provider  fluticasone (FLONASE) 50 MCG/ACT nasal spray Place 2 sprays into both nostrils daily. Patient taking differently: Place 2 sprays into both nostrils as needed.  04/02/18   Elsie Stain, MD  ibuprofen (ADVIL) 800 MG tablet Take 1 tablet (800 mg total)  by mouth every 8 (eight) hours as needed. 11/27/19   Kinsinger, Arta Bruce, MD  Multiple Vitamins-Minerals (ADULT ONE DAILY GUMMIES PO) Take 2 each by mouth 2 (two) times a week.    [provider]  omeprazole (PRILOSEC) 20 MG capsule Take 20 mg by mouth daily.    [provider]  oxyCODONE (OXY IR/ROXICODONE) 5 MG immediate release tablet Take 1 tablet (5 mg total) by mouth every 6 (six) hours as needed for severe pain. 11/27/19   Kinsinger, Arta Bruce, MD  sildenafil (REVATIO) 20 MG tablet TAKE 2 TABLETS BY MOUTH DAILY AS NEEDED. Patient taking differently: Take 20 mg by mouth as needed.  03/31/19   Martinique, Betty G, MD    Allergies    Codeine  Review of Systems   Review of  Systems  Constitutional:  Negative for diaphoresis and fever.  Eyes:  Negative for redness.  Respiratory:  Negative for cough and shortness of breath.   Cardiovascular:  Positive for chest pain. Negative for palpitations and leg swelling.  Gastrointestinal:  Positive for abdominal pain and nausea. Negative for vomiting.  Genitourinary:  Positive for flank pain. Negative for dysuria.  Musculoskeletal:  Positive for back pain. Negative for neck pain.  Skin:  Negative for rash.  Neurological:  Negative for syncope and light-headedness.  Psychiatric/Behavioral:  The patient is not nervous/anxious.    Physical Exam Updated Vital Signs BP 125/82 (BP Location: Right Arm)   Pulse (!) 58   Temp 98.1 F (36.7 C)   Resp 16   Ht '5\' 6"'$  (1.676 m)   Wt 86.2 kg   SpO2 100%   BMI 30.67 kg/m   Physical Exam Vitals and nursing note reviewed.  Constitutional:      Appearance: He is well-developed. He is not diaphoretic.  HENT:     Head: Normocephalic and atraumatic.     Mouth/Throat:     Mouth: Mucous membranes are not dry.  Eyes:     Conjunctiva/sclera: Conjunctivae normal.  Neck:     Vascular: Normal carotid pulses. No carotid bruit or JVD.     Trachea: Trachea normal. No tracheal deviation.  Cardiovascular:     Rate and Rhythm: Normal rate and regular rhythm.     Pulses: No decreased pulses.          Radial pulses are 2+ on the right side and 2+ on the left side.     Heart sounds: Normal heart sounds, S1 normal and S2 normal. Heart sounds not distant. No murmur heard. Pulmonary:     Effort: Pulmonary effort is normal. No respiratory distress.     Breath sounds: Normal breath sounds. No wheezing.  Chest:     Chest wall: No tenderness.  Abdominal:     General: Bowel sounds are normal.     Palpations: Abdomen is soft.     Tenderness: There is no abdominal tenderness. There is no guarding or rebound.     Comments: Patient with mild left-sided abdominal pain, nonlocalized, no rebound or  guarding  Musculoskeletal:     Cervical back: Normal range of motion and neck supple. No muscular tenderness.     Right lower leg: No edema.     Left lower leg: No edema.     Comments: Patient is tender over the paraspinous musculature of the lower thoracic spine and upper lumbar spine on the left side.  Patient has significant pain in this area when he attempts to stand or move in bed.  Skin:  General: Skin is warm and dry.     Coloration: Skin is not pale.  Neurological:     Mental Status: He is alert. Mental status is at baseline.     Comments: 5 out of 5 strength in bilateral lower extremities.  No reported pain.  Psychiatric:        Mood and Affect: Mood normal.    ED Results / Procedures / Treatments   Labs (all labs ordered are listed, but only abnormal results are displayed) Labs Reviewed  COMPREHENSIVE METABOLIC PANEL - Abnormal; Notable for the following components:      Result Value   Calcium 8.8 (*)    All other components within normal limits  URINALYSIS, ROUTINE W REFLEX MICROSCOPIC - Abnormal; Notable for the following components:   Specific Gravity, Urine 1.034 (*)    Protein, ur TRACE (*)    All other components within normal limits  CBC  LIPASE, BLOOD  TROPONIN I (HIGH SENSITIVITY)  TROPONIN I (HIGH SENSITIVITY)    EKG EKG Interpretation  Date/Time:  Sunday November 07 2020 12:34:47 EDT Ventricular Rate:  56 PR Interval:  144 QRS Duration: 92 QT Interval:  432 QTC Calculation: 416 R Axis:   33 Text Interpretation: Sinus bradycardia Otherwise normal ECG since last tracing no significant change Confirmed by Malvin Johns [id number]) on 11/07/2020 12:38:37 PM  Radiology DG Chest 2 View  Result Date: 11/07/2020 CLINICAL DATA:  Chest pain radiating to the back.  Nausea. EXAM: CHEST - 2 VIEW COMPARISON:  Chest radiographs 03/03/2018. Right upper quadrant ultrasound 02/15/2016. No other comparison studies available. FINDINGS: The heart size and mediastinal  contours are stable. The lungs appear clear. There is no pleural effusion or pneumothorax. Prominent nipple shadows are noted bilaterally. There are degenerative changes throughout the spine. No acute osseous findings are seen. There is a large peripherally calcified mass in the left upper quadrant of the abdomen which measures approximately 13.2 x 12.2 cm. This appears unchanged from the previous radiographs, but was not included on the right upper quadrant ultrasound. 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 IMPRESSION: 1. Stable chest without evidence of active cardiopulmonary process. 2. 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 Based on the size of this lesion, this could be symptomatic (e.g. early satiety). Electronically Signed   By: Richardean Sale M.D.   On: 11/07/2020 12:58    Procedures Procedures   Medications Ordered in ED Medications  fentaNYL (SUBLIMAZE) injection 50 mcg (has no administration in time range)  ondansetron (ZOFRAN) injection 4 mg (has no administration in time range)    ED Course  I have reviewed the triage vital signs and the nursing notes.  Pertinent labs & imaging results that were available during my care of the patient were reviewed by me and considered in my medical decision making (see chart for details).  Patient seen and examined. Work-up initiated. Medications ordered.  Initial impression is that pain is musculoskeletal in nature as patient has worsening pain with movement and palpation in the lower back area.  However he does report abdominal pain and mild pain radiating to his chest.  Cardiac work-up will be performed.  We will check CMP and lipase as well.  We will treat pain.  Chest x-ray shows stable pancreatic pseudocyst.  Vital signs reviewed and are as follows: BP 125/82 (BP Location: Right Arm)   Pulse (!) 58    Temp 98.1 F (  36.7 C)   Resp 16   Ht '5\' 6"'$  (1.676 m)   Wt 86.2 kg   SpO2 100%   BMI 30.67 kg/m   2:10 PM Patient discussed with and seen by Dr. Tamera Punt -- CT scan ordered given abdominal pain and back pain, h/o splenic cyst to r/o possible other cause.   4:19 PM patient's pain better controlled.  Updated on results.  CT does not show any acute findings.  Pancreatic cyst present.  Discussed need for PCP follow-up regarding this, although looks stable at this time.  Discussed back pain precautions.  Will prescribe short course of oxycodone, Robaxin, naproxen.  No red flag s/s of low back pain. Patient was counseled on back pain precautions and told to do activity as tolerated but do not lift, push, or pull heavy objects more than 10 pounds for the next week.  Patient counseled to use ice or heat on back for no longer than 15 minutes every hour.   Patient counseled on proper use of muscle relaxant and narcotic  medication.  They were told not to drink alcohol, drive any vehicle, or do any dangerous activities while taking this medication.  Patient verbalized understanding.  Patient is allergic to codeine but has had oxycodone in the past, after previous hernia surgery.  He tolerated this without difficulty.  Patient urged to follow-up with PCP if pain does not improve with treatment and rest or if pain becomes recurrent. Urged to return with worsening severe pain, loss of bowel or bladder control, trouble walking.   The patient verbalizes understanding and agrees with the plan.     MDM Rules/Calculators/A&P                           Patient with low back pain, however also complains of abdominal pain and chest pain.  Pain seems to radiate from the low back.  He was evaluated with blood work, chest x-ray, CT of the abdomen pelvis.  Work-up here is overall very reassuring.  His back pain is reproducible with movement and palpation and seems musculoskeletal in nature.  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  Symptoms controlled well in the emergency department.  Cardiac work-up including EKG, chest x-ray, troponin were reassuring.  Symptoms are atypical for ACS, PE, or other serious thoracic etiology.  Do not suspect dissection today.   Final Clinical Impression(s) / ED Diagnoses Final diagnoses:  Acute left-sided low back pain without sciatica  Left upper quadrant abdominal pain  Splenic cyst    Rx / DC Orders ED Discharge Orders          Ordered    oxyCODONE (OXY IR/ROXICODONE) 5 MG immediate release tablet  Every 6 hours PRN        11/07/20 1617    methocarbamol (ROBAXIN) 500 MG tablet  4 times daily        11/07/20 1617    naproxen (NAPROSYN) 500 MG tablet  2 times daily        11/07/20 1617             Carlisle Cater, PA-C 11/07/20 1622    Malvin Johns, MD 11/11/20 1505

## 2020-11-07 NOTE — Discharge Instructions (Signed)
Please read and follow all provided instructions.  Your diagnoses today include:  1. Acute left-sided low back pain without sciatica   2. Left upper quadrant abdominal pain   3. Splenic cyst     Tests performed today include: Vital signs - see below for your results today Blood cell counts (white, red, and platelets) Electrolytes  Kidney function test Blood test for heart irritation - was normal CT scan of the abdomen and pelvis -shows splenic pseudocyst, no major problems in the abdomen  Medications prescribed:  Oxycodone - narcotic pain medication  DO NOT drive or perform any activities that require you to be awake and alert because this medicine can make you drowsy.   Robaxin (methocarbamol) - muscle relaxer medication  DO NOT drive or perform any activities that require you to be awake and alert because this medicine can make you drowsy.   Naproxen - anti-inflammatory pain medication Do not exceed '500mg'$  naproxen every 12 hours, take with food  You have been prescribed an anti-inflammatory medication or NSAID. Take with food. Take smallest effective dose for the shortest duration needed for your pain. Stop taking if you experience stomach pain or vomiting.   Take any prescribed medications only as directed.  Home care instructions:  Follow any educational materials contained in this packet Please rest, use ice or heat on your back for the next several days Do not lift, push, pull anything more than 10 pounds for the next week  Follow-up instructions: Please follow-up with your primary care provider in the next 1 week for further evaluation of your symptoms.   Return instructions:  SEEK IMMEDIATE MEDICAL ATTENTION IF YOU HAVE: New numbness, tingling, weakness, or problem with the use of your arms or legs Severe back pain not relieved with medications Loss control of your bowels or bladder Increasing pain in any areas of the body (such as chest or abdominal  pain) Shortness of breath, dizziness, or fainting.  Worsening nausea (feeling sick to your stomach), vomiting, fever, or sweats Any other emergent concerns regarding your health   Additional Information:  Your vital signs today were: BP 117/79   Pulse 69   Temp 98.1 F (36.7 C)   Resp 12   Ht '5\' 6"'$  (1.676 m)   Wt 86.2 kg   SpO2 94%   BMI 30.67 kg/m  If your blood pressure (BP) was elevated above 135/85 this visit, please have this repeated by your doctor within one month. --------------

## 2020-11-07 NOTE — ED Notes (Signed)
As I write this, he is in x-ray.

## 2021-01-04 ENCOUNTER — Other Ambulatory Visit: Payer: Self-pay

## 2021-01-04 ENCOUNTER — Emergency Department (HOSPITAL_BASED_OUTPATIENT_CLINIC_OR_DEPARTMENT_OTHER)
Admission: EM | Admit: 2021-01-04 | Discharge: 2021-01-04 | Disposition: A | Discharge status: Home / Self Care | Payer: Self-pay | Attending: Student | Admitting: Student

## 2021-01-04 ENCOUNTER — Emergency Department (HOSPITAL_BASED_OUTPATIENT_CLINIC_OR_DEPARTMENT_OTHER): Payer: Self-pay | Admitting: Radiology

## 2021-01-04 ENCOUNTER — Encounter (HOSPITAL_BASED_OUTPATIENT_CLINIC_OR_DEPARTMENT_OTHER): Payer: Self-pay | Admitting: Emergency Medicine

## 2021-01-04 DIAGNOSIS — Y92007 Garden or yard of unspecified non-institutional (private) residence as the place of occurrence of the external cause: Secondary | ICD-10-CM | POA: Insufficient documentation

## 2021-01-04 DIAGNOSIS — W19XXXA Unspecified fall, initial encounter: Secondary | ICD-10-CM | POA: Insufficient documentation

## 2021-01-04 DIAGNOSIS — Z8616 Personal history of COVID-19: Secondary | ICD-10-CM | POA: Insufficient documentation

## 2021-01-04 DIAGNOSIS — S8992XA Unspecified injury of left lower leg, initial encounter: Secondary | ICD-10-CM | POA: Insufficient documentation

## 2021-01-04 NOTE — Discharge Instructions (Addendum)
You were seen in the emergency department today after a left knee injury.  As we discussed your x-ray did not show any acute fractures or dislocations.  However I do think it would be beneficial for you to follow-up with the orthopedic doctor and get an MRI of your knee.  From there you can discuss if there is any need for surgery.  I have attached their contact information for you to make an appointment.  We have placed your knee in an immobilizer.  Please use Tylenol or ibuprofen for pain.  You may use 600 mg ibuprofen every 6 hours or 1000 mg of Tylenol every 6 hours.  You may choose to alternate between the 2.  This would be most effective.  Not to exceed 4 g of Tylenol within 24 hours.  Not to exceed 3200 mg ibuprofen 24 hours.   I would also continue using the RICE method which includes rest, ice, compression, and elevation.  Continue to monitor how you're doing and return to the ER for new or worsening symptoms such as inability to bend your knee or any new injury.   It has been a pleasure seeing and caring for you today and I hope you start feeling better soon!

## 2021-01-04 NOTE — ED Provider Notes (Signed)
[facility] EMERGENCY DEPT Provider Note   CSN: [id number] Arrival date & time: 01/04/21  1752     History Chief Complaint  Patient presents with   Knee Injury    Peter Archer is a 55 y.o. male who presents emergency department today with left knee injury.  Patient states that at about 6 PM he stepped in a hole in his yard, and felt his knee go the opposite direction.  He reports hearing a "pop" noise.  Since then he states that his knee has felt weak and he had difficulty bearing weight.  No other injuries noted.  No head trauma or LOC.  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) ago but never had it rechecked - [facility]- told benign    Bilateral inguinal hernia    Environmental and seasonal allergies    GERD (gastroesophageal reflux disease)    History of 2019 novel coronavirus disease (COVID-19)    per pt positive test 10/ 2020 with mild symptoms that resolved in a week   Umbilical hernia     Patient Active Problem List   Diagnosis Date Noted   GERD (gastroesophageal reflux disease) 04/02/2018   Seasonal allergies 04/02/2018   Cough present for greater than 3 weeks 04/02/2018   Closed nondisplaced fracture of distal phalanx of left middle finger 08/28/2017    Past Surgical History:  Procedure Laterality Date   APPENDECTOMY  1985 approx.   COLONOSCOPY  04/2019   INGUINAL HERNIA REPAIR Bilateral 11/27/2019   Procedure: LAPAROSCOPIC INGUINAL HERNIA WITH MESH;  Surgeon: Kinsinger, Arta Bruce, MD;  Location: [facility];  Service: General;  Laterality: Bilateral;   UMBILICAL HERNIA REPAIR N/A 11/27/2019   Procedure: OPEN HERNIA REPAIR UMBILICAL WITH MESH;  Surgeon: Kinsinger, Arta Bruce, MD;  Location: [facility];  Service: General;  Laterality: N/A;       Family History  Problem Relation Age of Onset   Heart disease  Father    Stroke Paternal Grandmother    Cancer Paternal Grandmother        colon   Colon cancer Neg Hx    Colon polyps Neg Hx    Esophageal cancer Neg Hx    Stomach cancer Neg Hx    Rectal cancer Neg Hx     Social History   Tobacco Use   Smoking status: Never   Smokeless tobacco: Never  Vaping Use   Vaping Use: Never used  Substance Use Topics   Alcohol use: Yes    Comment: occasional   Drug use: Never    Home Medications Prior to Admission medications   Medication Sig Start Date End Date Taking? Authorizing Provider  fluticasone (FLONASE) 50 MCG/ACT nasal spray Place 2 sprays into both nostrils daily. Patient taking differently: Place 2 sprays into both nostrils as needed.  04/02/18   Elsie Stain, MD  methocarbamol (ROBAXIN) 500 MG tablet Take 2 tablets (1,000 mg total) by mouth 4 (four) times daily. 11/07/20   Regan Lemming, MD  Multiple Vitamins-Minerals (ADULT ONE DAILY GUMMIES PO) Take 2 each by mouth 2 (two) times a week.    [provider]  naproxen (NAPROSYN) 500 MG tablet Take 1 tablet (500 mg total) by mouth 2 (two) times daily. 11/07/20   Regan Lemming, MD  omeprazole (PRILOSEC) 20 MG capsule Take 20 mg by mouth daily.  [provider]  oxyCODONE (OXY IR/ROXICODONE) 5 MG immediate release tablet Take 1 tablet (5 mg total) by mouth every 6 (six) hours as needed for severe pain. 11/07/20   Regan Lemming, MD  sildenafil (REVATIO) 20 MG tablet TAKE 2 TABLETS BY MOUTH DAILY AS NEEDED. Patient taking differently: Take 20 mg by mouth as needed.  03/31/19   Martinique, Betty G, MD    Allergies    Codeine  Review of Systems   Review of Systems  Musculoskeletal:        Left knee pain  All other systems reviewed and are negative.  Physical Exam Updated Vital Signs BP 122/85 (BP Location: Left Arm)   Pulse 75   Temp 97.7 F (36.5 C) (Temporal)   Resp 16   Ht 5\' 6"  (1.676 m)   Wt 88.5 kg   SpO2 99%   BMI 31.47 kg/m   Physical Exam Vitals  and nursing note reviewed.  Constitutional:      Appearance: Normal appearance.  HENT:     Head: Normocephalic and atraumatic.  Eyes:     Conjunctiva/sclera: Conjunctivae normal.  Pulmonary:     Effort: Pulmonary effort is normal. No respiratory distress.  Musculoskeletal:     Comments: Full passive flexion and extension of left knee.  Diffuse swelling of the left knee with no focal tenderness.  Sensation intact bilaterally.  Pulses equal and normal in BLE.  Skin:    General: Skin is warm and dry.  Neurological:     Mental Status: He is alert.  Psychiatric:        Mood and Affect: Mood normal.        Behavior: Behavior normal.    ED Results / Procedures / Treatments   Labs (all labs ordered are listed, but only abnormal results are displayed) Labs Reviewed - No data to display  EKG None  Radiology DG Knee Complete 4 Views Left  Result Date: 01/04/2021 CLINICAL DATA:  Left knee pain after injury. Stepped in a hole. Fall. EXAM: LEFT KNEE - COMPLETE 4+ VIEW COMPARISON:  None. FINDINGS: No fracture or dislocation. The joint spaces are preserved. Minimal patellofemoral medial tibiofemoral spurring. There is a small quadriceps tendon enthesophyte. No joint effusion. Mild soft tissue edema seen anteriorly. IMPRESSION: 1. No acute fracture or dislocation of the left knee. 2. Mild soft tissue edema. Electronically Signed   By: Keith Rake M.D.   On: 01/04/2021 19:04    Procedures Procedures   Medications Ordered in ED Medications - No data to display  ED Course  I have reviewed the triage vital signs and the nursing notes.  Pertinent labs & imaging results that were available during my care of the patient were reviewed by me and considered in my medical decision making (see chart for details).    MDM Rules/Calculators/A&P                           Patient is a 55 year old male who presents to the emergency department with left knee injury occurring several hours ago.   Patient states that he stepped in a hole, fell and twisted his knee.  Since then he reports that his knee has been weak and he had difficulty bearing weight.  No other trauma noted. He is not on chronic anticoagulation.  On exam patient has full passive flexion and extension of left knee.  Sensation is intact bilaterally.  Pulses equal and normal in bilateral lower extremities.  There is diffuse swelling over the left knee, with no focal tenderness.  X-rays negative for acute fracture dislocation, but did comment on diffuse soft tissue swelling.    It is possible there is a ligamentous injury.  Unable to obtain MRIs at this location.  However patient is otherwise stable and is not requiring admission or inpatient treatment for his symptoms at this time.  Plan to place in knee immobilizer and referred to orthopedics.  Discussed reasons to return to the emergency department.  Patient agreeable to plan.  Final Clinical Impression(s) / ED Diagnoses Final diagnoses:  Injury of left knee, initial encounter    Rx / DC Orders ED Discharge Orders     None      Portions of this report may have been transcribed using voice recognition software. Every effort was made to ensure accuracy; however, inadvertent computerized transcription errors may be present.    Estill Cotta 01/04/21 2136    Teressa Lower, MD 01/10/21 [phone]

## 2021-01-04 NOTE — ED Triage Notes (Signed)
Pt arrives to ED with c/o of left knee injury/pain. Pt reports that he stepped in a hole, fell and hurt his knee. He reports his knee made a "pop" noise. Since the incident he reports his knee is weak and he is unable to bear weight. No injury to head or neck.

## 2021-01-11 ENCOUNTER — Other Ambulatory Visit: Payer: Self-pay

## 2021-01-11 ENCOUNTER — Encounter: Payer: Self-pay | Admitting: Orthopaedic Surgery

## 2021-01-11 ENCOUNTER — Ambulatory Visit (INDEPENDENT_AMBULATORY_CARE_PROVIDER_SITE_OTHER): Payer: Self-pay | Admitting: Orthopaedic Surgery

## 2021-01-11 DIAGNOSIS — M25562 Pain in left knee: Secondary | ICD-10-CM

## 2021-01-11 DIAGNOSIS — G8929 Other chronic pain: Secondary | ICD-10-CM

## 2021-01-11 NOTE — Progress Notes (Signed)
Office Visit Note   Patient: Peter Archer           Date of Birth: [birth year]           MRN: [id number] Visit Date: 01/11/2021              Requested by: Martinique, Betty G, MD [address] [place],  [place] [address] PCP: Martinique, Betty G, MD   Assessment & Plan: Visit Diagnoses:  1. Chronic pain of left knee     Plan: Impression is left knee medial tear versus MCL sprain.  I believe the patient may have a combination of the 2 and we have discussed placing him in a Bledsoe brace.  We have also discussed intra-articular cortisone injection for which he would like to proceed.  Light duty note provided for four weeks.  He will follow-up with [place] in 4 weeks time for recheck.  Call with concerns or questions in the meantime.  Follow-Up Instructions: Return in about 4 weeks (around 02/08/2021).   Orders:  Orders Placed This Encounter  Procedures   Large Joint Inj: L knee    No orders of the defined types were placed in this encounter.     Procedures: Large Joint Inj: L knee on 01/11/2021 9:39 AM Indications: pain Details: 22 G needle, anterolateral approach Medications: 2 mL lidocaine 1 %; 2 mL bupivacaine 0.25 %; 40 mg methylPREDNISolone acetate 40 MG/ML     Clinical Data: No additional findings.   Subjective: Chief Complaint  Patient presents with   Left Knee - Pain    HPI patient is a pleasant 55 year old gentleman who comes in today with pain to the left knee.  He sustained an injury about a week ago when he was getting out of his car and he stepped in a hole twisting the left knee.  He did state he had a fair amount of swelling the day of the injury which improved quite a bit with ice.  He was seen in the ED where it was thought he had an ACL tear and was placed in a knee immobilizer.  He feels as though the knee immobilizer has not helped so he has not been wearing this.  He has had continued pain to the medial aspect worse with pivoting.  He notes a few  instances of instability.  No locking or catching.  No previous injury to the left knee.  Review of Systems as detailed in HPI.  All others reviewed and are negative.   Objective: Vital Signs: There were no vitals taken for this visit.  Physical Exam well-developed well-nourished gentleman in no acute distress.  Alert and oriented x3.  Ortho Exam left knee exam shows trace effusion.  Range of motion 0 to 115 degrees.  He does have moderate tenderness to the medial joint line around the MCL.  He has 1+ laxity with valgus stress.  Negative anterior drawer and Lachman.  He is neurovascular intact distally.  Specialty Comments:  No specialty comments available.  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) ago but  never had it rechecked - [facility]- told benign    Bilateral inguinal hernia    Environmental and seasonal allergies    GERD (gastroesophageal reflux disease)    History of 2019 novel coronavirus disease (COVID-19)    per pt positive test 10/ 2020 with mild symptoms that resolved in a week   Umbilical hernia     Family History  Problem Relation Age of Onset   Heart disease Father    Stroke Paternal Grandmother    Cancer Paternal Grandmother        colon   Colon cancer Neg Hx    Colon polyps Neg Hx    Esophageal cancer Neg Hx    Stomach cancer Neg Hx    Rectal cancer Neg Hx     Past Surgical History:  Procedure Laterality Date   APPENDECTOMY  1985 approx.   COLONOSCOPY  04/2019   INGUINAL HERNIA REPAIR Bilateral 11/27/2019   Procedure: LAPAROSCOPIC INGUINAL HERNIA  WITH MESH;  Surgeon: Kinsinger, Arta Bruce, MD;  Location: [facility];  Service: General;  Laterality: Bilateral;   UMBILICAL HERNIA REPAIR N/A 11/27/2019   Procedure: OPEN HERNIA REPAIR UMBILICAL WITH MESH;  Surgeon: Kinsinger, Arta Bruce, MD;  Location: [facility];  Service: General;  Laterality: N/A;   Social History   Occupational History   Not on file  Tobacco Use   Smoking status: Never   Smokeless tobacco: Never  Vaping Use   Vaping Use: Never used  Substance and Sexual Activity   Alcohol use: Yes    Comment: occasional   Drug use: Never   Sexual activity: Yes

## 2021-01-12 MED ORDER — LIDOCAINE HCL 1 % IJ SOLN
2.0000 mL | INTRAMUSCULAR | Status: AC | PRN
  Administered 2021-01-11: 10:00:00 2 mL

## 2021-01-12 MED ORDER — BUPIVACAINE HCL 0.25 % IJ SOLN
2.0000 mL | INTRAMUSCULAR | Status: AC | PRN
  Administered 2021-01-11: 10:00:00 2 mL via INTRA_ARTICULAR

## 2021-01-12 MED ORDER — METHYLPREDNISOLONE ACETATE 40 MG/ML IJ SUSP
40.0000 mg | INTRAMUSCULAR | Status: AC | PRN
  Administered 2021-01-11: 10:00:00 40 mg via INTRA_ARTICULAR

## 2021-01-13 ENCOUNTER — Telehealth: Payer: Self-pay | Admitting: Orthopaedic Surgery

## 2021-01-13 NOTE — Telephone Encounter (Signed)
Pt's boss called requesting a call back concerning pt's light duty work note. He is asking for clarification. Please call Braulio Conte  at phone [phone].

## 2021-01-13 NOTE — Telephone Encounter (Signed)
Pt job called about work note he received. He understand pt can't drive but he doesn't know what light duty is. He states he doesn't think anything there is light duty. 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 He would like a call back.   CB [phone] -- clarance

## 2021-01-14 NOTE — Telephone Encounter (Signed)
See message.

## 2021-01-14 NOTE — Telephone Encounter (Signed)
Called patient, no answer and vm is full.  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

## 2021-01-14 NOTE — Telephone Encounter (Signed)
Message sent to to provider already. See other msg.

## 2021-01-17 NOTE — Telephone Encounter (Signed)
Patient is very upset because they do not let him come back to work.   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 No lifting restrictions."

## 2021-01-17 NOTE — Telephone Encounter (Signed)
That is ok to do

## 2021-01-17 NOTE — Telephone Encounter (Signed)
Pt called concerning the work note. Pt's job called last night and told him he could not come to work this morning as he did not know what light duty for the pt means. Pt said we can put the clarified note on his mychart and he can print it from there. The best call back number for the pt is [phone].

## 2021-01-17 NOTE — Telephone Encounter (Signed)
Note made.  

## 2021-02-10 ENCOUNTER — Encounter: Payer: Self-pay | Admitting: Orthopaedic Surgery

## 2021-02-10 ENCOUNTER — Ambulatory Visit (INDEPENDENT_AMBULATORY_CARE_PROVIDER_SITE_OTHER): Payer: Self-pay | Admitting: Orthopaedic Surgery

## 2021-02-10 ENCOUNTER — Other Ambulatory Visit: Payer: Self-pay

## 2021-02-10 ENCOUNTER — Telehealth: Payer: Self-pay | Admitting: Orthopaedic Surgery

## 2021-02-10 DIAGNOSIS — M25562 Pain in left knee: Secondary | ICD-10-CM

## 2021-02-10 DIAGNOSIS — G8929 Other chronic pain: Secondary | ICD-10-CM

## 2021-02-10 NOTE — Progress Notes (Signed)
Office Visit Note   Patient: Peter Archer           Date of Birth: 06/02/1965           MRN: [id number] Visit Date: 02/10/2021              Requested by: Martinique, Betty G, MD [address] [place],  [place] [address] PCP: Martinique, Betty G, MD   Assessment & Plan: Visit Diagnoses:  1. Chronic pain of left knee     Plan: [name] follows up today for his left knee injury.  He feels that it is overall doing better.  The brace is helping.  He has occasional swelling at times.  Denies any mechanical symptoms.  Left knee shows no joint effusion.  Normal range of motion.  Slight tenderness along the MCL.  Collaterals and cruciates are stable.  Slight medial joint line tenderness.  Negative McMurray.  [name] continues to improve from his knee injury probable MCL sprain.  We will allow him to return back to work without restrictions as long as he is able to wear the hinged knee brace until the knee feels completely well.  I explained that if at some point he feels like its not getting better we can always think about getting an MRI.  Work note provided today.  Follow-Up Instructions: Return if symptoms worsen or fail to improve.   Orders:  No orders of the defined types were placed in this encounter.  No orders of the defined types were placed in this encounter.     Procedures: No procedures performed   Clinical Data: No additional findings.   Subjective: Chief Complaint  Patient presents with   Left Knee - Follow-up    HPI  Review of Systems  Constitutional: Negative.   All other systems reviewed and are negative.   Objective: Vital Signs: There were no vitals taken for this visit.  Physical Exam Vitals and nursing note reviewed.  Constitutional:      Appearance: He is well-developed.  Pulmonary:     Effort: Pulmonary effort is normal.  Abdominal:     Palpations: Abdomen is soft.  Skin:    General: Skin is warm.  Neurological:     Mental Status:  He is alert and oriented to person, place, and time.  Psychiatric:        Behavior: Behavior normal.        Thought Content: Thought content normal.        Judgment: Judgment normal.    Ortho Exam  Specialty Comments:  No specialty comments available.  Imaging: No results found.   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) ago but never had it rechecked - [facility]- told benign    Bilateral inguinal hernia    Environmental and seasonal allergies    GERD (gastroesophageal reflux disease)    History of 2019 novel coronavirus disease (COVID-19)    per pt positive test 10/ 2020 with mild symptoms that resolved in a week   Umbilical hernia     Family History  Problem Relation Age of  Onset   Heart disease Father    Stroke Paternal Grandmother    Cancer Paternal Grandmother        colon   Colon cancer Neg Hx    Colon polyps Neg Hx    Esophageal cancer Neg Hx    Stomach cancer Neg Hx    Rectal cancer Neg Hx     Past Surgical History:  Procedure Laterality Date   APPENDECTOMY  1985 approx.   COLONOSCOPY  04/2019   INGUINAL HERNIA REPAIR Bilateral 11/27/2019   Procedure: LAPAROSCOPIC INGUINAL HERNIA WITH MESH;  Surgeon: Kinsinger, Arta Bruce, MD;  Location: [facility];  Service: General;  Laterality: Bilateral;   UMBILICAL HERNIA REPAIR N/A 11/27/2019   Procedure: OPEN HERNIA REPAIR UMBILICAL WITH MESH;  Surgeon: Kinsinger, Arta Bruce, MD;  Location: [facility];  Service: General;  Laterality: N/A;   Social History   Occupational History   Not on file  Tobacco Use    Smoking status: Never   Smokeless tobacco: Never  Vaping Use   Vaping Use: Never used  Substance and Sexual Activity   Alcohol use: Yes    Comment: occasional   Drug use: Never   Sexual activity: Yes

## 2021-02-10 NOTE — Telephone Encounter (Signed)
Received $25.00 cash,medical records release form and disability paperwork from patient/Forwarding to [employer] today  

## 2022-04-15 IMAGING — DX DG KNEE COMPLETE 4+V*L*
4 series · 4 of 4 positions shown · non-contrast
Comparison: None.

CLINICAL DATA: Left knee pain after injury. Stepped in a hole.
Fall.

EXAM:
LEFT KNEE - COMPLETE 4+ VIEW

[knee obl (1 of 2)]
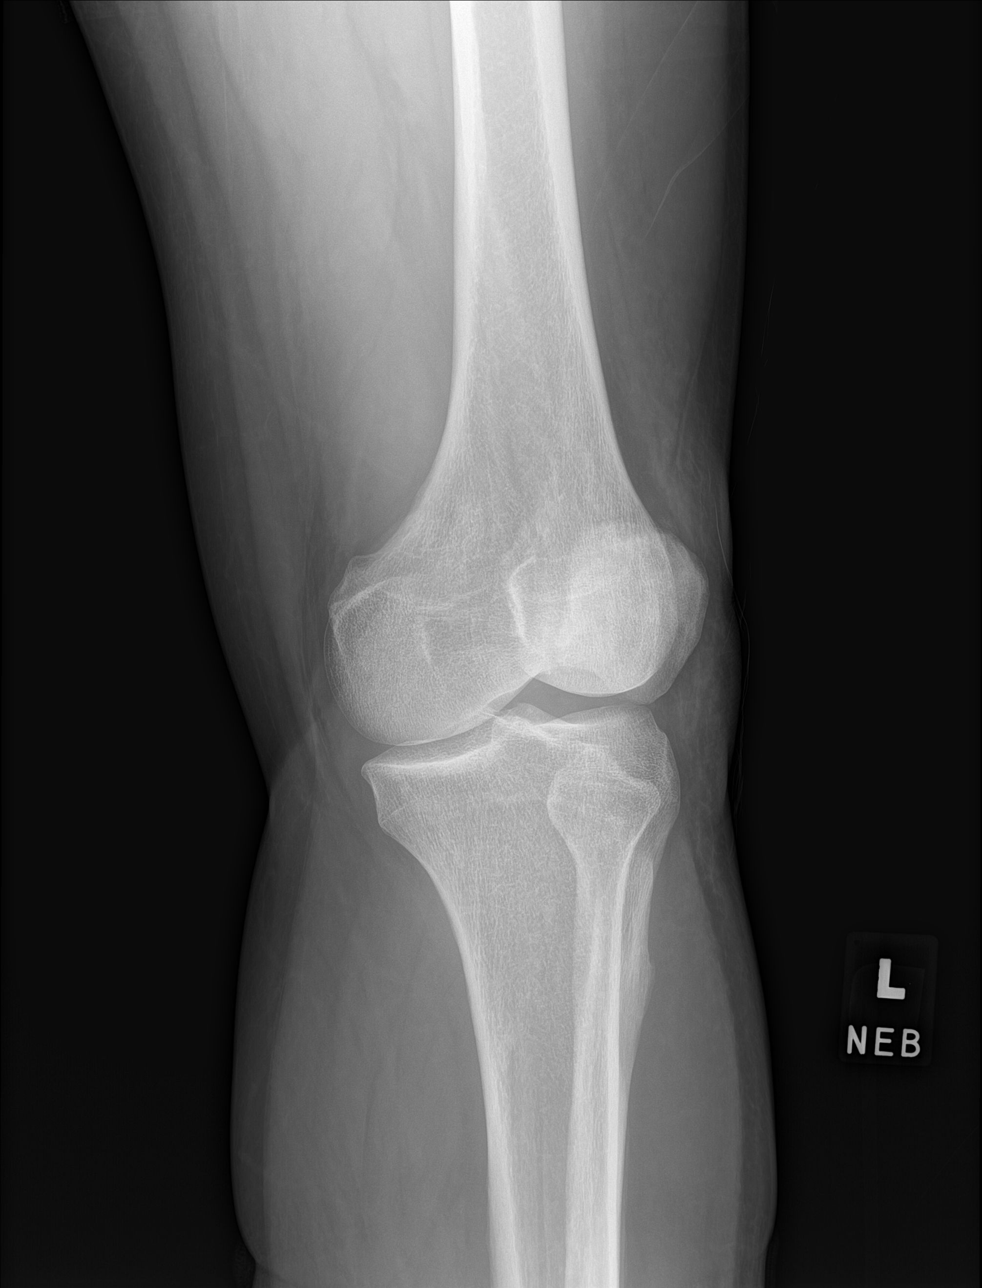

[knee ap]
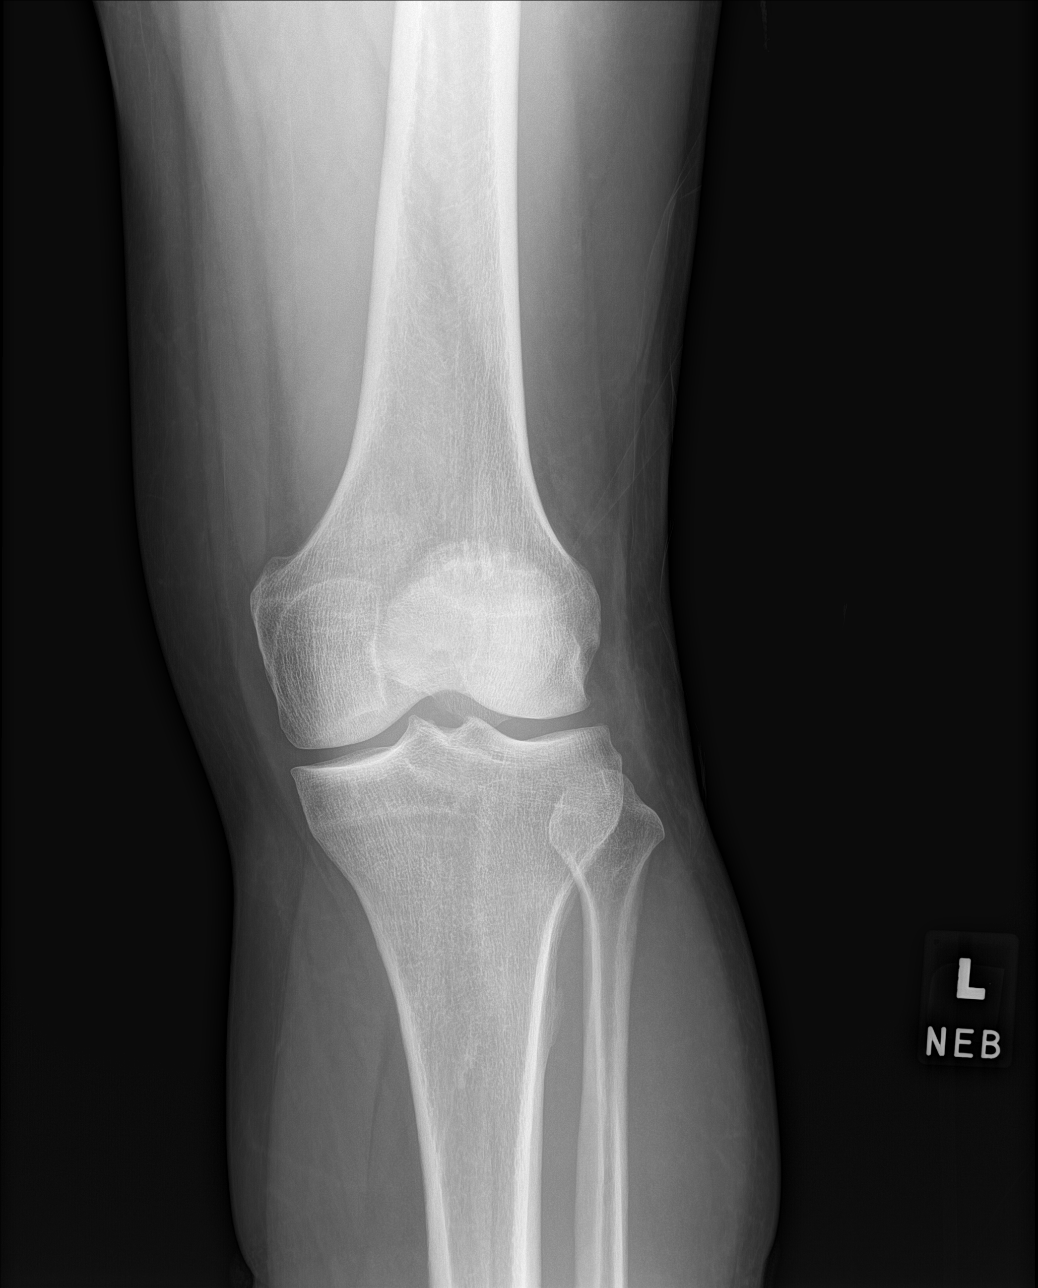

[knee obl (2 of 2)]
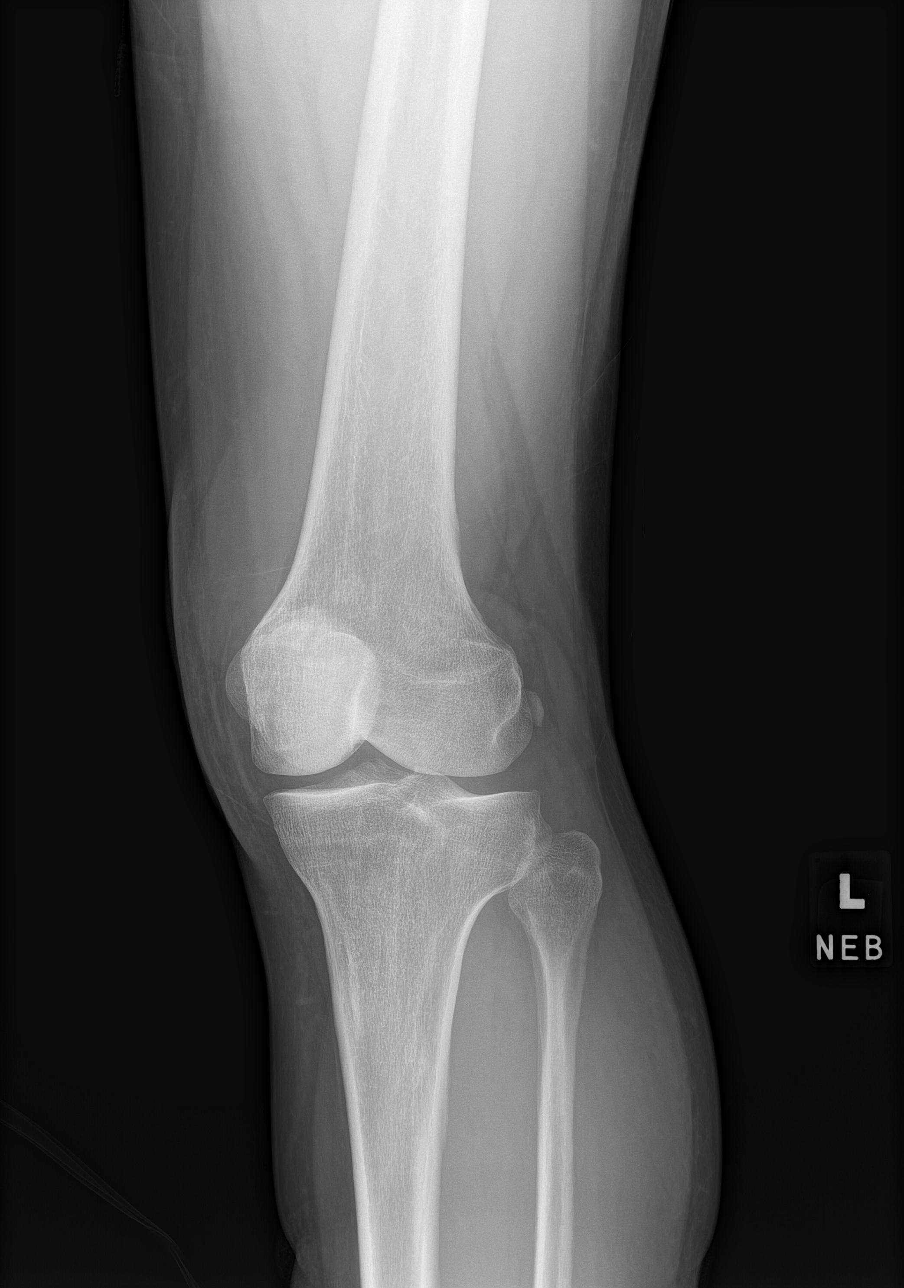

[knee lat]
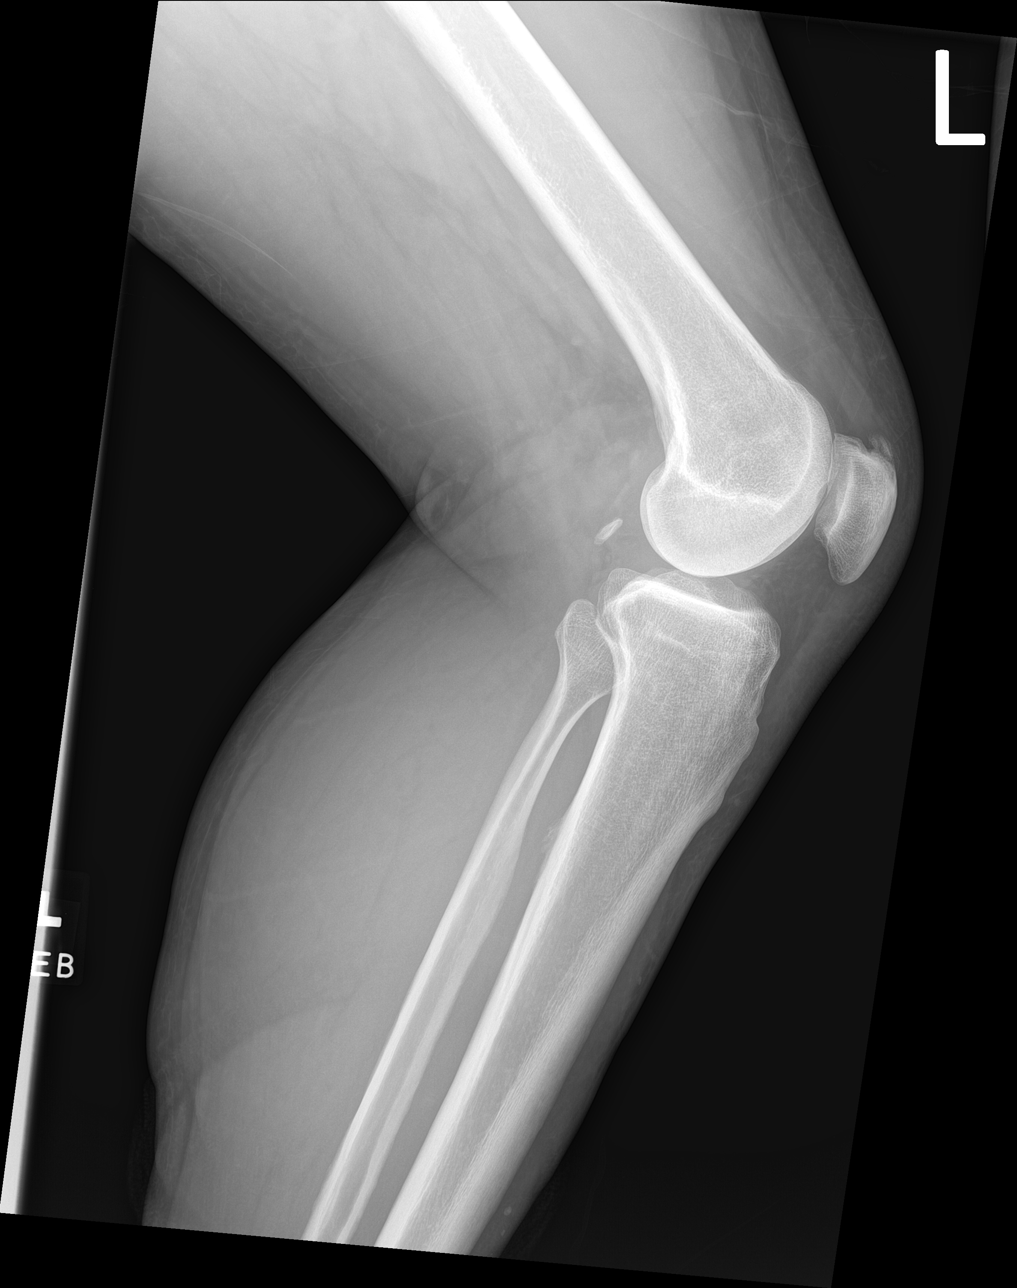

[4 of 4 positions shown; findings below may reference images not displayed]

FINDINGS: No fracture or dislocation. The joint spaces are preserved. Minimal
patellofemoral medial tibiofemoral spurring. There is a small
quadriceps tendon enthesophyte. No joint effusion. Mild soft tissue
edema seen anteriorly.
IMPRESSION: 1. No acute fracture or dislocation of the left knee.
2. Mild soft tissue edema.

## 2022-04-24 ENCOUNTER — Encounter: Payer: Self-pay | Admitting: Family Medicine

## 2022-06-09 ENCOUNTER — Encounter: Payer: Self-pay | Admitting: Gastroenterology

## 2022-11-17 ENCOUNTER — Encounter: Payer: Self-pay | Admitting: Gastroenterology

## 2022-12-25 ENCOUNTER — Ambulatory Visit: Payer: Managed Care, Other (non HMO) | Admitting: Family Medicine

## 2022-12-25 ENCOUNTER — Encounter: Payer: Self-pay | Admitting: Family Medicine

## 2022-12-25 VITALS — BP 128/84 | HR 73 | Temp 97.9°F | Ht 65.75 in | Wt 199.8 lb

## 2022-12-25 DIAGNOSIS — Z23 Encounter for immunization: Secondary | ICD-10-CM

## 2022-12-25 DIAGNOSIS — R1011 Right upper quadrant pain: Secondary | ICD-10-CM

## 2022-12-25 DIAGNOSIS — K219 Gastro-esophageal reflux disease without esophagitis: Secondary | ICD-10-CM | POA: Diagnosis not present

## 2022-12-25 DIAGNOSIS — D734 Cyst of spleen: Secondary | ICD-10-CM | POA: Diagnosis not present

## 2022-12-25 DIAGNOSIS — Z8719 Personal history of other diseases of the digestive system: Secondary | ICD-10-CM

## 2022-12-25 MED ORDER — OMEPRAZOLE 40 MG PO CPDR: 40.0000 mg | DELAYED_RELEASE_CAPSULE | Freq: Every day | ORAL | 3 refills | Status: DC

## 2022-12-25 NOTE — Progress Notes (Signed)
Assessment/Plan:   Problem List Items Addressed This Visit       Digestive   GERD (gastroesophageal reflux disease) - Primary    Incomplete control, incomplete adherence  Increase omeprazole to 40 mg daily Dietary modifications discussed Follow-up gastroenterology      Relevant Medications   omeprazole (PRILOSEC) 40 MG capsule   Other Relevant Orders   Ambulatory referral to Gastroenterology     Other   Postprandial RUQ pain    Intermittent right upper quadrant pain, possibly related to sitting posture or eating habits.   Differential includes MSK, gallbladder disease vs pancreatitis (due to history in 2012)  Referral to gastroenterology consultation for further evaluation.  Consider repeat blood work including CBC, LFTs, amylase, lipase and CT versus ultrasound to assess hepatobiliary/pancreatic anatomy.      Relevant Orders   Ambulatory referral to Gastroenterology   Splenic cyst    Unchanged appearance and calcifications seen on CT abdomen/pelvis from 2012-2022. Likely benign given lack of change.   Consider evaluation with abdominal imaging on evaluating right upper quadrant pain as above      Relevant Orders   Ambulatory referral to Gastroenterology   Other Visit Diagnoses     Immunization due       Relevant Orders   Flu vaccine trivalent PF, 6mos and older(Flulaval,Afluria,Fluarix,Fluzone) (Completed)   History of pancreatitis       Relevant Orders   Ambulatory referral to Gastroenterology       Medications Discontinued During This Encounter  Medication Reason   Multiple Vitamins-Minerals (ADULT ONE DAILY GUMMIES PO)    methocarbamol (ROBAXIN) 500 MG tablet    fluticasone (FLONASE) 50 MCG/ACT nasal spray    sildenafil (REVATIO) 20 MG tablet    oxyCODONE (OXY IR/ROXICODONE) 5 MG immediate release tablet    omeprazole (PRILOSEC) 20 MG capsule Reorder    Return in about 4 weeks (around 01/22/2023) for physical (fasting labs).    Subjective:    Encounter date: 12/25/2022  Peter Archer is a 57 y.o. male who has Closed nondisplaced fracture of distal phalanx of left middle finger; GERD (gastroesophageal reflux disease); Seasonal allergies; Cough present for greater than 3 weeks; Postprandial RUQ pain; and Splenic cyst on their problem list..   He  has a past medical history of Abnormal CT of the abdomen, Bilateral inguinal hernia, Environmental and seasonal allergies, GERD (gastroesophageal reflux disease), History of 2019 novel coronavirus disease (COVID-19), and Umbilical hernia..   Chief Complaint: Establishing care and discussing multiple health concerns including right upper quadrant pain, history of abdominal tumor, and potential screening for prostate cancer.  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 Patient is concerned it might be related to gallbladder issues.  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  Prostate Screening: Inquired about PSA blood test for prostate cancer screening.  Upcoming Colonoscopy: Patient has an upcoming colonoscopy and has had colonoscopies before which revealed polyps. No significant changes in bowel habits or blood in stool noted.  Heartburn/GERD: On omeprazole 20 mg daily, which helps some. Occasionally skips doses.  Review of Systems  Constitutional:  Negative for chills, diaphoresis, fever, malaise/fatigue and weight loss.  HENT:  Negative for congestion, ear discharge, ear pain and hearing loss.   Eyes:  Negative for blurred vision, double vision, photophobia, pain, discharge and redness.  Respiratory:  Negative for cough, sputum production,  shortness of breath and wheezing.   Cardiovascular:  Negative for chest pain and palpitations.  Gastrointestinal:  Positive for abdominal pain and heartburn.  Negative for blood in stool, constipation, diarrhea, melena, nausea and vomiting.  Genitourinary:  Negative for dysuria, flank pain, frequency, hematuria and urgency.  Musculoskeletal:  Negative for myalgias.  Skin:  Negative for itching and rash.  Neurological:  Negative for dizziness, tingling, tremors, speech change, seizures, loss of consciousness, weakness and headaches.  Psychiatric/Behavioral:  Negative for depression, hallucinations, memory loss, substance abuse and suicidal ideas. The patient does not have insomnia.   All other systems reviewed and are negative.   Past Surgical History:  Procedure Laterality Date   APPENDECTOMY  1985 approx.   COLONOSCOPY  04/2019   INGUINAL HERNIA REPAIR Bilateral 11/27/2019   Procedure: LAPAROSCOPIC INGUINAL HERNIA WITH MESH;  Surgeon: Kinsinger, De Blanch, MD;  Location: [facility];  Service: General;  Laterality: Bilateral;   UMBILICAL HERNIA REPAIR N/A 11/27/2019   Procedure: OPEN HERNIA REPAIR UMBILICAL WITH MESH;  Surgeon: Kinsinger, De Blanch, MD;  Location: [facility];  Service: General;  Laterality: N/A;    Outpatient Medications Prior to Visit  Medication Sig Dispense Refill   naproxen (NAPROSYN) 500 MG tablet Take 1 tablet (500 mg total) by mouth 2 (two) times daily. 20 tablet 0   omeprazole (PRILOSEC) 20 MG capsule Take 40 mg by mouth daily.     fluticasone (FLONASE) 50 MCG/ACT nasal spray Place 2 sprays into both nostrils daily. (Patient not taking: Reported on 12/25/2022) 16 g 1   methocarbamol (ROBAXIN) 500 MG tablet Take 2 tablets (1,000 mg total) by mouth 4 (four) times daily. (Patient not taking: Reported on 12/25/2022) 20 tablet 0   Multiple Vitamins-Minerals (ADULT ONE DAILY GUMMIES PO) Take 2 each by mouth 2 (two) times a week. (Patient not taking: Reported on 12/25/2022)     oxyCODONE (OXY IR/ROXICODONE) 5 MG immediate release tablet Take 1 tablet (5 mg total) by mouth every 6 (six) hours as  needed for severe pain. (Patient not taking: Reported on 12/25/2022) 6 tablet 0   sildenafil (REVATIO) 20 MG tablet TAKE 2 TABLETS BY MOUTH DAILY AS NEEDED. (Patient not taking: Reported on 12/25/2022) 60 tablet 1   No facility-administered medications prior to visit.    Family History  Problem Relation Age of Onset   Heart disease Father    Stroke Paternal Grandmother    Cancer Paternal Grandmother        colon   Colon cancer Neg Hx    Colon polyps Neg Hx    Esophageal cancer Neg Hx    Stomach cancer Neg Hx    Rectal cancer Neg Hx     Social History   Socioeconomic History   Marital status: Divorced    Spouse name: Not on file   Number of children: Not on file   Years of education: Not on file   Highest education level: Not on file  Occupational History   Not on file  Tobacco Use   Smoking status: Never    Passive exposure: Never   Smokeless tobacco: Never  Vaping Use   Vaping status: Never Used  Substance and Sexual Activity   Alcohol use: Yes    Comment: occasional   Drug use: Never   Sexual activity: Yes  Other Topics Concern   Not on file  Social History Narrative   Not on file   Social Determinants of Health   Financial Resource Strain:  Not on file  Food Insecurity: Not on file  Transportation Needs: Not on file  Physical Activity: Not on file  Stress: Not on file  Social Connections: Not on file  Intimate Partner Violence: Not on file                                                                                                  Objective:  Physical Exam: BP 128/84 (BP Location: Left Arm, Patient Position: Sitting, Cuff Size: Large)   Pulse 73   Temp 97.9 F (36.6 C) (Temporal)   Ht 5' 5.75" (1.67 m)   Wt 199 lb 12.8 oz (90.6 kg)   SpO2 97%   BMI 32.49 kg/m     Physical Exam Constitutional:      Appearance: Normal appearance.  HENT:     Head: Normocephalic and atraumatic.     Right Ear: Hearing normal.     Left Ear: Hearing normal.      Nose: Nose normal.  Eyes:     General: No scleral icterus.       Right eye: No discharge.        Left eye: No discharge.     Extraocular Movements: Extraocular movements intact.  Cardiovascular:     Rate and Rhythm: Normal rate and regular rhythm.     Heart sounds: Normal heart sounds.  Pulmonary:     Effort: Pulmonary effort is normal.     Breath sounds: Normal breath sounds.  Abdominal:     Palpations: Abdomen is soft.     Tenderness: There is no abdominal tenderness.  Skin:    General: Skin is warm.     Findings: No rash.  Neurological:     General: No focal deficit present.     Mental Status: He is alert.     Cranial Nerves: No cranial nerve deficit.  Psychiatric:        Mood and Affect: Mood normal.        Behavior: Behavior normal.        Thought Content: Thought content normal.        Judgment: Judgment normal.     No results found.  No results found for this or any previous visit (from the past 2160 hour(s)).      Garner Nash, MD, MS

## 2022-12-25 NOTE — Assessment & Plan Note (Signed)
Incomplete control, incomplete adherence  Increase omeprazole to 40 mg daily Dietary modifications discussed Follow-up gastroenterology

## 2022-12-25 NOTE — Assessment & Plan Note (Signed)
Unchanged appearance and calcifications seen on CT abdomen/pelvis from 2012-2022. Likely benign given lack of change.   Consider evaluation with abdominal imaging on evaluating right upper quadrant pain as above

## 2022-12-25 NOTE — Assessment & Plan Note (Signed)
Intermittent right upper quadrant pain, possibly related to sitting posture or eating habits.   Differential includes MSK, gallbladder disease vs pancreatitis (due to history in 2012)  Referral to gastroenterology consultation for further evaluation.  Consider repeat blood work including CBC, LFTs, amylase, lipase and CT versus ultrasound to assess hepatobiliary/pancreatic anatomy.

## 2022-12-25 NOTE — Patient Instructions (Signed)
-   Schedule an appointment with a gastroenterologist for a more detailed evaluation of your right-sided pain and possible issues with your liver and pancreas. - Increase your dose of omeprazole to 40 milligrams daily to help manage your heartburn symptoms. - Try to cut back on fatty and spicy foods, and include more fiber, fruits, and vegetables in your diet. - Plan to return for fasting lab work and a physical exam, where we can also discuss your prostate health. - Continue to monitor your condition and note any changes, especially related to your abdominal pain and heartburn.

## 2023-01-01 ENCOUNTER — Ambulatory Visit (AMBULATORY_SURGERY_CENTER): Payer: Managed Care, Other (non HMO)

## 2023-01-01 VITALS — Ht 65.75 in | Wt 199.0 lb

## 2023-01-01 DIAGNOSIS — Z860100 Personal history of colon polyps, unspecified: Secondary | ICD-10-CM

## 2023-01-01 MED ORDER — NA SULFATE-K SULFATE-MG SULF 17.5-3.13-1.6 GM/177ML PO SOLN
1.0000 | Freq: Once | ORAL | 0 refills | Status: AC
Start: 2023-01-01 — End: 2023-01-01

## 2023-01-01 NOTE — Progress Notes (Signed)
Pre visit completed via phone call; Patient verified name, DOB, and address; No egg or soy allergy known to patient  No issues known to pt with past sedation with any surgeries or procedures; Patient denies ever being told they had issues or difficulty with intubation;  No FH of Malignant Hyperthermia; Pt is not on diet pills; Pt is not on home 02;  Pt is not on blood thinners;  Pt denies issues with constipation  No A fib or A flutter; Have any cardiac testing pending--NO Insurance verified during PV appt--- [employer] Pt can ambulate without assistance;  Pt denies use of chewing tobacco; Discussed diabetic/weight loss medication holds; Discussed NSAID holds; Checked BMI to be less than 50; Pt instructed to use [employer] or GoodRx for a price reduction on prep;  Patient's chart reviewed by Cathlyn Parsons CNRA prior to previsit and patient appropriate for the [facility];  Pre visit completed and red dot placed by patient's name on their procedure day (on provider's schedule);    Instructions sent to MyChart as well as printed and mailed to the patient per his request;

## 2023-01-12 ENCOUNTER — Encounter: Payer: Self-pay | Admitting: Gastroenterology

## 2023-01-22 ENCOUNTER — Ambulatory Visit: Payer: Managed Care, Other (non HMO) | Admitting: Gastroenterology

## 2023-01-22 ENCOUNTER — Encounter: Payer: Self-pay | Admitting: Gastroenterology

## 2023-01-22 VITALS — BP 116/49 | HR 59 | Temp 97.8°F | Resp 13 | Ht 65.75 in | Wt 199.0 lb

## 2023-01-22 DIAGNOSIS — D124 Benign neoplasm of descending colon: Secondary | ICD-10-CM | POA: Diagnosis not present

## 2023-01-22 DIAGNOSIS — D122 Benign neoplasm of ascending colon: Secondary | ICD-10-CM | POA: Diagnosis not present

## 2023-01-22 DIAGNOSIS — Z09 Encounter for follow-up examination after completed treatment for conditions other than malignant neoplasm: Secondary | ICD-10-CM | POA: Diagnosis present

## 2023-01-22 DIAGNOSIS — Z860100 Personal history of colon polyps, unspecified: Secondary | ICD-10-CM | POA: Diagnosis not present

## 2023-01-22 MED ORDER — SODIUM CHLORIDE 0.9 % IV SOLN: 500.0000 mL | Freq: Once | INTRAVENOUS | Status: DC

## 2023-01-22 NOTE — Progress Notes (Signed)
Called to room to assist during endoscopic procedure.  Patient ID and intended procedure confirmed with present staff. Received instructions for my participation in the procedure from the performing physician.  

## 2023-01-22 NOTE — Patient Instructions (Signed)
Educational handout provided to patient related to Hemorrhoids, Polyps  Resume previous diet  Continue present medications  Awaiting pathology results   YOU HAD AN ENDOSCOPIC PROCEDURE TODAY AT THE [facility]:   Refer to the procedure report that was given to you for any specific questions about what was found during the examination.  If the procedure report does not answer your questions, please call your gastroenterologist to clarify.  If you requested that your care partner not be given the details of your procedure findings, then the procedure report has been included in a sealed envelope for you to review at your convenience later.  YOU SHOULD EXPECT: Some feelings of bloating in the abdomen. Passage of more gas than usual.  Walking can help get rid of the air that was put into your GI tract during the procedure and reduce the bloating. If you had a lower endoscopy (such as a colonoscopy or flexible sigmoidoscopy) you may notice spotting of blood in your stool or on the toilet paper. If you underwent a bowel prep for your procedure, you may not have a normal bowel movement for a few days.  Please Note:  You might notice some irritation and congestion in your nose or some drainage.  This is from the oxygen used during your procedure.  There is no need for concern and it should clear up in a day or so.  SYMPTOMS TO REPORT IMMEDIATELY:  Following lower endoscopy (colonoscopy or flexible sigmoidoscopy):  Excessive amounts of blood in the stool  Significant tenderness or worsening of abdominal pains  Swelling of the abdomen that is new, acute  Fever of 100F or higher  For urgent or emergent issues, a gastroenterologist can be reached at any hour by calling [phone]. Do not use MyChart messaging for urgent concerns.    DIET:  We do recommend a small meal at first, but then you may proceed to your regular diet.  Drink plenty of fluids but you should avoid alcoholic  beverages for 24 hours.  ACTIVITY:  You should plan to take it easy for the rest of today and you should NOT DRIVE or use heavy machinery until tomorrow (because of the sedation medicines used during the test).    FOLLOW UP: Our staff will call the number listed on your records the next business day following your procedure.  We will call around 7:15- 8:00 am to check on you and address any questions or concerns that you may have regarding the information given to you following your procedure. If we do not reach you, we will leave a message.     If any biopsies were taken you will be contacted by phone or by letter within the next 1-3 weeks.  Please call us at [phone] if you have not heard about the biopsies in 3 weeks.    SIGNATURES/CONFIDENTIALITY: You and/or your care partner have signed paperwork which will be entered into your electronic medical record.  These signatures attest to the fact that that the information above on your After Visit Summary has been reviewed and is understood.  Full responsibility of the confidentiality of this discharge information lies with you and/or your care-partner.

## 2023-01-22 NOTE — Progress Notes (Signed)
Sedate, gd SR, tolerated procedure well, VSS, report to RN 

## 2023-01-22 NOTE — Progress Notes (Signed)
Pt's states no medical or surgical changes since previsit or office visit. 

## 2023-01-22 NOTE — Op Note (Signed)
[facility] Patient Name: Peter Archer Procedure Date: 01/22/2023 8:30 AM MRN: [id number] Endoscopist: Lynann Bologna , MD, [id number] Age: 57 Referring MD:  Date of Birth: April 17, 1965 Gender: Male Account #: [id number] Procedure:                Colonoscopy Indications:              High risk colon cancer surveillance: Personal                            history of colonic polyps 2021 Medicines:                Monitored Anesthesia Care Procedure:                Pre-Anesthesia Assessment:                           - Prior to the procedure, a History and Physical                            was performed, and patient medications and                            allergies were reviewed. The patient's tolerance of                            previous anesthesia was also reviewed. The risks                            and benefits of the procedure and the sedation                            options and risks were discussed with the patient.                            All questions were answered, and informed consent                            was obtained. Prior Anticoagulants: The patient has                            taken no anticoagulant or antiplatelet agents. ASA                            Grade Assessment: II - A patient with mild systemic                            disease. After reviewing the risks and benefits,                            the patient was deemed in satisfactory condition to                            undergo the procedure.  After obtaining informed consent, the colonoscope                            was passed under direct vision. Throughout the                            procedure, the patient's blood pressure, pulse, and                            oxygen saturations were monitored continuously. The                            Olympus Scope SN: [id number] was introduced through                            the anus and advanced to the the  cecum, identified                            by appendiceal orifice and ileocecal valve. The                            colonoscopy was performed without difficulty. The                            patient tolerated the procedure well. The quality                            of the bowel preparation was good. The ileocecal                            valve, appendiceal orifice, and rectum were                            photographed. Scope In: 8:42:45 AM Scope Out: 8:50:35 AM Scope Withdrawal Time: 0 hours 6 minutes 10 seconds  Total Procedure Duration: 0 hours 7 minutes 50 seconds  Findings:                 A 6 mm polyp was found in the mid descending colon.                            The polyp was sessile. The polyp was removed with a                            cold snare. Resection and retrieval were complete.                           Non-bleeding internal hemorrhoids were found during                            retroflexion. The hemorrhoids were small and Grade                            I (internal hemorrhoids that do  not prolapse).                           The exam was otherwise without abnormality on                            direct and retroflexion views.                           The entire examined colon appeared normal. Complications:            No immediate complications. Estimated Blood Loss:     Estimated blood loss: none. Impression:               - One 6 mm polyp in the mid descending colon,                            removed with a cold snare. Resected and retrieved.                           - Non-bleeding internal hemorrhoids.                           - The examination was otherwise normal on direct                            and retroflexion views.                           - The entire examined colon is normal. Recommendation:           - Patient has a contact number available for                            emergencies. The signs and symptoms of potential                             delayed complications were discussed with the                            patient. Return to normal activities tomorrow.                            Written discharge instructions were provided to the                            patient.                           - Resume previous diet.                           - Continue present medications.                           - Await pathology results.                           -  Repeat colonoscopy for surveillance based on                            pathology results.                           - The findings and recommendations were discussed                            with the patient's family. Lynann Bologna, MD 01/22/2023 8:54:25 AM This report has been signed electronically.

## 2023-01-23 ENCOUNTER — Telehealth: Payer: Self-pay

## 2023-01-23 ENCOUNTER — Telehealth: Payer: Self-pay | Admitting: Family Medicine

## 2023-01-23 NOTE — Telephone Encounter (Signed)
  Follow up Call-     01/22/2023    7:49 AM  Call back number  Post procedure Call Back phone  # [phone]  Permission to leave phone message Yes     Patient questions:  Do you have a fever, pain , or abdominal swelling? No. Pain Score  0 *  Have you tolerated food without any problems? Yes.    Have you been able to return to your normal activities? Yes.    Do you have any questions about your discharge instructions: Diet   No. Medications  No. Follow up visit  No.  Do you have questions or concerns about your Care? No.  Actions: * If pain score is 4 or above: No action needed, pain <4.

## 2023-01-23 NOTE — Telephone Encounter (Signed)
Pt has a referral at [facility] He wants this referral at the office he just went to. [facility], [facility]  He feels since he has been there a few times that is what he would like.  Please let him know through mychart.

## 2023-01-23 NOTE — Telephone Encounter (Signed)
done

## 2023-01-24 LAB — SURGICAL PATHOLOGY

## 2023-01-25 ENCOUNTER — Encounter: Payer: Self-pay | Admitting: Gastroenterology

## 2023-01-29 ENCOUNTER — Encounter: Payer: Managed Care, Other (non HMO) | Admitting: Family Medicine

## 2023-02-19 ENCOUNTER — Encounter: Payer: Managed Care, Other (non HMO) | Admitting: Family Medicine

## 2023-03-19 ENCOUNTER — Ambulatory Visit (INDEPENDENT_AMBULATORY_CARE_PROVIDER_SITE_OTHER): Payer: Managed Care, Other (non HMO) | Admitting: Family Medicine

## 2023-03-19 ENCOUNTER — Encounter: Payer: Self-pay | Admitting: Family Medicine

## 2023-03-19 VITALS — BP 128/74 | HR 84 | Temp 97.7°F | Ht 65.75 in | Wt 203.8 lb

## 2023-03-19 DIAGNOSIS — R040 Epistaxis: Secondary | ICD-10-CM

## 2023-03-19 DIAGNOSIS — E66811 Obesity, class 1: Secondary | ICD-10-CM | POA: Diagnosis not present

## 2023-03-19 DIAGNOSIS — Z8719 Personal history of other diseases of the digestive system: Secondary | ICD-10-CM | POA: Diagnosis not present

## 2023-03-19 DIAGNOSIS — Z0000 Encounter for general adult medical examination without abnormal findings: Secondary | ICD-10-CM | POA: Diagnosis not present

## 2023-03-19 DIAGNOSIS — E782 Mixed hyperlipidemia: Secondary | ICD-10-CM | POA: Diagnosis not present

## 2023-03-19 DIAGNOSIS — Z125 Encounter for screening for malignant neoplasm of prostate: Secondary | ICD-10-CM

## 2023-03-19 DIAGNOSIS — Z6833 Body mass index (BMI) 33.0-33.9, adult: Secondary | ICD-10-CM | POA: Diagnosis not present

## 2023-03-19 DIAGNOSIS — R972 Elevated prostate specific antigen [PSA]: Secondary | ICD-10-CM

## 2023-03-19 DIAGNOSIS — E6609 Other obesity due to excess calories: Secondary | ICD-10-CM

## 2023-03-19 DIAGNOSIS — K219 Gastro-esophageal reflux disease without esophagitis: Secondary | ICD-10-CM

## 2023-03-19 DIAGNOSIS — R350 Frequency of micturition: Secondary | ICD-10-CM

## 2023-03-19 LAB — CBC WITH DIFFERENTIAL/PLATELET
Basophils Absolute: 0.1 10*3/uL (ref 0.0–0.1)
Basophils Relative: 1.1 % (ref 0.0–3.0)
Eosinophils Absolute: 0.1 10*3/uL (ref 0.0–0.7)
Eosinophils Relative: 1.9 % (ref 0.0–5.0)
HCT: 41.2 % (ref 39.0–52.0)
Hemoglobin: 13.3 g/dL (ref 13.0–17.0)
Lymphocytes Relative: 28.1 % (ref 12.0–46.0)
Lymphs Abs: 1.9 10*3/uL (ref 0.7–4.0)
MCHC: 32.3 g/dL (ref 30.0–36.0)
MCV: 81 fL (ref 78.0–100.0)
Monocytes Absolute: 0.5 10*3/uL (ref 0.1–1.0)
Monocytes Relative: 6.9 % (ref 3.0–12.0)
Neutro Abs: 4.2 10*3/uL (ref 1.4–7.7)
Neutrophils Relative %: 62 % (ref 43.0–77.0)
Platelets: 310 10*3/uL (ref 150.0–400.0)
RBC: 5.09 Mil/uL (ref 4.22–5.81)
RDW: 14 % (ref 11.5–15.5)
WBC: 6.7 10*3/uL (ref 4.0–10.5)

## 2023-03-19 LAB — LIPID PANEL
Cholesterol: 185 mg/dL (ref 0–200)
HDL: 38.1 mg/dL — ABNORMAL LOW (ref >39.00)
LDL Cholesterol: 123 mg/dL — ABNORMAL HIGH (ref 0–99)
NonHDL: 146.6
Total CHOL/HDL Ratio: 5
Triglycerides: 116 mg/dL (ref 0.0–149.0)
VLDL: 23.2 mg/dL (ref 0.0–40.0)

## 2023-03-19 LAB — COMPREHENSIVE METABOLIC PANEL
ALT: 22 U/L (ref 0–53)
AST: 19 U/L (ref 0–37)
Albumin: 4.1 g/dL (ref 3.5–5.2)
Alkaline Phosphatase: 74 U/L (ref 39–117)
BUN: 8 mg/dL (ref 6–23)
CO2: 27 meq/L (ref 19–32)
Calcium: 8.6 mg/dL (ref 8.4–10.5)
Chloride: 107 meq/L (ref 96–112)
Creatinine, Ser: 0.96 mg/dL (ref 0.40–1.50)
GFR: 86.79 mL/min (ref >60.00)
Glucose, Bld: 94 mg/dL (ref 70–99)
Potassium: 3.9 meq/L (ref 3.5–5.1)
Sodium: 141 meq/L (ref 135–145)
Total Bilirubin: 0.9 mg/dL (ref 0.2–1.2)
Total Protein: 6.4 g/dL (ref 6.0–8.3)

## 2023-03-19 LAB — MICROALBUMIN / CREATININE URINE RATIO
Creatinine,U: 132.4 mg/dL
Microalb Creat Ratio: 0.5 mg/g (ref 0.0–30.0)
Microalb, Ur: <0.7 mg/dL (ref 0.0–1.9)

## 2023-03-19 LAB — PSA: PSA: 6.23 ng/mL — ABNORMAL HIGH (ref 0.10–4.00)

## 2023-03-19 LAB — LIPASE: Lipase: 49 U/L (ref 11.0–59.0)

## 2023-03-19 LAB — TSH: TSH: 2.35 u[IU]/mL (ref 0.35–5.50)

## 2023-03-19 LAB — HEMOGLOBIN A1C: Hgb A1c MFr Bld: 6.1 % (ref 4.6–6.5)

## 2023-03-19 NOTE — Patient Instructions (Signed)
-   Schedule an appointment with Dr. Charlanne at [facility] to follow up on your abdominal pain and previous benign abdominal tumor. - Continue your omeprazole  dosage to 40 mg daily as advised, and take it consistently to manage your GERD symptoms. - Implement lifestyle changes to help reduce heartburn: avoid fatty and spicy foods, eat more fruits and vegetables, and maintain regular meal times.

## 2023-03-19 NOTE — Assessment & Plan Note (Addendum)
 Stable with medication; symptomatic with dietary indiscretions. Plan: Continue omeprazole  40?mg once daily. Encouraged dietary modifications to avoid trigger foods. Advised to report any worsening of symptoms. Recommend follow-up with gastroenterology to discuss any further GERD management, concerns about abdomina mass, or abdominal pain assessment

## 2023-03-19 NOTE — Assessment & Plan Note (Signed)
 Check urinalysis to assess for UTI.  Possibly related to prostate health and will recheck PSA.

## 2023-03-19 NOTE — Assessment & Plan Note (Signed)
 Acute episode, currently resolved. Plan: Educated patient on preventive measures: Use of nasal saline sprays as needed. Humidification at night to prevent mucosal dryness. Avoidance of nasal trauma. Advised to monitor for recurrence. If episodes recur, consider referral to ENT for further evaluation.

## 2023-03-19 NOTE — Progress Notes (Signed)
 Assessment  Assessment/Plan:   Problem List Items Addressed This Visit       Digestive   GERD (gastroesophageal reflux disease) - Primary   Stable with medication; symptomatic with dietary indiscretions. Plan: Continue omeprazole  40?mg once daily. Encouraged dietary modifications to avoid trigger foods. Advised to report any worsening of symptoms. Recommend follow-up with gastroenterology to discuss any further GERD management, concerns about abdomina mass, or abdominal pain assessment      Relevant Orders   CBC with Differential/Platelet     Other   Epistaxis not due to trauma   Acute episode, currently resolved. Plan: Educated patient on preventive measures: Use of nasal saline sprays as needed. Humidification at night to prevent mucosal dryness. Avoidance of nasal trauma. Advised to monitor for recurrence. If episodes recur, consider referral to ENT for further evaluation.      Frequent urination   Check urinalysis to assess for UTI.  Possibly related to prostate health and will recheck PSA.      Relevant Orders   Urinalysis w microscopic + reflex cultur   Moderate mixed hyperlipidemia not requiring statin therapy   Ordered fasting lipid panel to reassess cholesterol levels. Discussed lifestyle modifications: Increase physical activity. Maintain a diet rich in fruits and vegetables. Will consider pharmacotherapy based on lab results.      Relevant Orders   Lipid panel   Class 1 obesity due to excess calories without serious comorbidity with body mass index (BMI) of 33.0 to 33.9 in adult   Relevant Orders   TSH   Hemoglobin A1c   Microalbumin / creatinine urine ratio   Comprehensive metabolic panel   Encounter for well adult exam without abnormal findings   Other Visit Diagnoses       History of pancreatitis       Relevant Orders   Lipase     Screening for malignant neoplasm of prostate       Relevant Orders   PSA       There are no  discontinued medications.  Patient Counseling(The following topics were reviewed and/or handout was given):  -Nutrition: Stressed importance of moderation in sodium/caffeine intake, saturated fat and cholesterol, caloric balance, sufficient intake of fresh fruits, vegetables, and fiber.  -Stressed the importance of regular exercise.   -Substance Abuse: Discussed cessation/primary prevention of tobacco, alcohol, or other drug use; driving or other dangerous activities under the influence; availability of treatment for abuse.   -Injury prevention: Discussed safety belts, safety helmets, smoke detector, smoking near bedding or upholstery.   -Sexuality: Discussed sexually transmitted diseases, partner selection, use of condoms, avoidance of unintended pregnancy and contraceptive alternatives.   -Dental health: Discussed importance of regular tooth brushing, flossing, and dental visits.  -Health maintenance and immunizations reviewed. Please refer to Health maintenance section.  Return to care in 1 year for next preventative visit.        Subjective:  Chief complaint Encounter date: 03/19/2023  Chief Complaint  Patient presents with   Annual Exam    Non fasting   Peter Archer is a 58 y.o. male who presents today for his annual comprehensive physical exam.    History of Present Illness:  Epistaxis: Experienced a significant right-sided nosebleed one week ago after sneezing while traveling. Required frequent packing with tissue; bleeding eventually stopped. No recurrence since.  GERD: Ongoing heartburn managed with omeprazole  20?mg daily. Symptoms triggered by specific foods, notably egg sandwiches. Reports improvement with medication adherence.  Gastroenterology Follow-Up: Confusion regarding follow-up after colonoscopy with  Dr. Charlanne. Previous concerns about a gastric tumor remain; unsure of next steps.  Sleep Patterns: Occasional snoring reported by partner. Denies daytime  sleepiness or nocturnal gasping.  Lifestyle: Walks dog regularly; limited additional exercise due to work. Diet includes fruits (bananas, oranges) and green vegetables daily.  Preventive Care: Has not visited an eye doctor or dentist in over a year.  Review of Systems: Constitutional: Denies fever, chills, weight loss. HEENT: Positive for recent nosebleed; denies headaches, visual changes, sore throat. Cardiovascular: Denies chest pain, palpitations. Respiratory: Denies shortness of breath, cough. Gastrointestinal: Reports heartburn and occasional epigastric discomfort; denies nausea, vomiting, diarrhea. Genitourinary: Denies urinary frequency, urgency, dysuria. Neurological: Denies dizziness, weakness. Psychiatric: Denies anxiety, depression. Sleep: Reports occasional snoring; denies excessive daytime sleepiness. Other systems: Negative.  ROS     03/19/2023    8:19 AM 12/25/2022    8:52 AM 04/02/2018    9:18 AM  Depression screen PHQ 2/9  Decreased Interest 0 0 0  Down, Depressed, Hopeless 0 0 0  PHQ - 2 Score 0 0 0  Altered sleeping 0 0 2  Tired, decreased energy 0 0 2  Change in appetite 0 0 0  Feeling bad or failure about yourself  0 0 0  Trouble concentrating 0 0 0  Moving slowly or fidgety/restless 0 0 0  Suicidal thoughts 0 0 0  PHQ-9 Score 0 0 4  Difficult doing work/chores Not difficult at all Not difficult at all        03/19/2023    8:19 AM 12/25/2022    8:52 AM 04/02/2018    9:18 AM  GAD 7 : Generalized Anxiety Score  Nervous, Anxious, on Edge 0 0 0  Control/stop worrying 0 0   Worry too much - different things 0 0 1  Trouble relaxing 0 0 0  Restless 0 0   Easily annoyed or irritable 0 0 1  Afraid - awful might happen 0 0 0  Total GAD 7 Score 0 0   Anxiety Difficulty Not difficult at all Not difficult at all     There are no preventive care reminders to display for this patient.   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   Bilateral inguinal hernia    Environmental and seasonal allergies    GERD (gastroesophageal reflux disease)    on meds   History of 2019 novel coronavirus disease (COVID-19)    per pt positive test 10/ 2020 with mild symptoms that resolved in a week   Umbilical hernia     Patient Active Problem List   Diagnosis Date Noted   Epistaxis not due to trauma 03/19/2023   Frequent urination 03/19/2023   Moderate mixed hyperlipidemia not requiring statin therapy 03/19/2023   Class 1 obesity due to excess calories without serious comorbidity with body mass index (BMI) of 33.0 to 33.9 in adult 03/19/2023   Encounter for well adult exam without abnormal findings 03/19/2023   Postprandial RUQ pain 12/25/2022   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   COLONOSCOPY  04/2019   INGUINAL HERNIA REPAIR Bilateral 11/27/2019   Procedure: LAPAROSCOPIC INGUINAL HERNIA WITH MESH;  Surgeon: Kinsinger, Herlene Righter, MD;  Location: [facility] [place];  Service: General;  Laterality: Bilateral;   UMBILICAL HERNIA REPAIR N/A 11/27/2019   Procedure: OPEN HERNIA REPAIR UMBILICAL WITH MESH;  Surgeon: Kinsinger, Herlene Righter, MD;  Location: [facility];  Service: General;  Laterality: N/A;    Family History  Problem Relation Age of Onset   Heart disease Father    Stroke Paternal Grandmother    Cancer Paternal Grandmother        colon   Colon cancer Neg Hx    Colon polyps Neg Hx     Esophageal cancer Neg Hx    Stomach cancer Neg Hx    Rectal cancer Neg Hx     Medications- reviewed and updated Outpatient Medications Prior to Visit  Medication Sig Dispense Refill   Acetaminophen  (8 HR ARTHRITIS PAIN RELIEF PO) Take 1 tablet by mouth daily at 6 (six) AM.     aspirin EC 81 MG tablet Take 81 mg by mouth daily. Swallow whole.     omeprazole  (PRILOSEC) 40 MG capsule Take 1 capsule (40 mg total) by mouth daily. 90 capsule 3   No facility-administered medications prior to visit.    Allergies  Allergen Reactions   Codeine Anaphylaxis    Social History   Socioeconomic History   Marital status: Divorced    Spouse name: Not on file   Number of children: Not on file   Years of education: Not on file   Highest education level: Not on file  Occupational History   Not on file  Tobacco Use   Smoking status: Never    Passive exposure: Never   Smokeless tobacco: Never  Vaping Use   Vaping status: Never Used  Substance and Sexual Activity   Alcohol use: Yes    Alcohol/week: 2.0 standard drinks of alcohol    Types: 2 Standard drinks or equivalent per week    Comment: occasional   Drug use: Never   Sexual activity: Yes  Other Topics Concern   Not on file  Social History Narrative   Not on file   Social Drivers of Health   Financial Resource Strain: Not on file  Food Insecurity: Not on file  Transportation Needs: Not on file  Physical Activity: Not on file  Stress: Not on file  Social Connections: Not on file           Objective:  Physical Exam: BP 128/74   Pulse 84   Temp 97.7 F (36.5 C) (Temporal)   Ht 5' 5.75 (1.67 m)   Wt 203 lb 12.8 oz (92.4 kg)   SpO2 99%   BMI 33.14 kg/m   Body mass index is 33.14 kg/m. Wt Readings from Last 3 Encounters:  03/19/23 203 lb 12.8 oz (92.4 kg)  01/22/23 199 lb (90.3 kg)  01/01/23 199 lb (90.3 kg)    Physical Exam Constitutional:      General: He is not in acute distress.    Appearance: Normal  appearance. He is not ill-appearing or toxic-appearing.  HENT:     Head: Normocephalic and atraumatic.     Right Ear: Hearing, tympanic membrane, ear canal and external ear normal. There is no impacted cerumen.     Left Ear: Hearing, tympanic membrane, ear canal and external ear  normal. There is no impacted cerumen.     Nose: Nose normal. No congestion.     Mouth/Throat:     Lips: No lesions.     Mouth: Mucous membranes are moist.     Pharynx: Oropharynx is clear. No oropharyngeal exudate.  Eyes:     General: No scleral icterus.       Right eye: No discharge.        Left eye: No discharge.     Conjunctiva/sclera: Conjunctivae normal.     Pupils: Pupils are equal, round, and reactive to light.  Neck:     Thyroid : No thyroid  mass, thyromegaly or thyroid  tenderness.  Cardiovascular:     Rate and Rhythm: Normal rate and regular rhythm.     Pulses: Normal pulses.     Heart sounds: Normal heart sounds.  Pulmonary:     Effort: Pulmonary effort is normal. No respiratory distress.     Breath sounds: Normal breath sounds.  Abdominal:     General: Abdomen is flat. Bowel sounds are normal.     Palpations: Abdomen is soft.  Musculoskeletal:        General: Normal range of motion.     Cervical back: Normal range of motion.     Right lower leg: No edema.     Left lower leg: No edema.  Lymphadenopathy:     Cervical: No cervical adenopathy.  Skin:    General: Skin is warm and dry.     Findings: No rash.  Neurological:     General: No focal deficit present.     Mental Status: He is alert and oriented to person, place, and time. Mental status is at baseline.     Deep Tendon Reflexes:     Reflex Scores:      Patellar reflexes are 2+ on the right side and 2+ on the left side. Psychiatric:        Mood and Affect: Mood normal.        Behavior: Behavior normal.        Thought Content: Thought content normal.        Judgment: Judgment normal.         At today's visit, we discussed  treatment options, associated risk and benefits, and engage in counseling as needed.  Additionally the following were reviewed: Past medical records, past medical and surgical history, family and social background, as well as relevant laboratory results, imaging findings, and specialty notes, where applicable.  This message was generated using dictation software, and as a result, it may contain unintentional typos or errors.  Nevertheless, extensive effort was made to accurately convey at the pertinent aspects of the patient visit.    There may have been are other unrelated non-urgent complaints, but due to the busy schedule and the amount of time already spent with him, time does not permit to address these issues at today's visit. Another appointment may have or has been requested to review these additional issues.     Arvella Hummer, MD, MS

## 2023-03-19 NOTE — Assessment & Plan Note (Signed)
 Ordered fasting lipid panel to reassess cholesterol levels. Discussed lifestyle modifications: Increase physical activity. Maintain a diet rich in fruits and vegetables. Will consider pharmacotherapy based on lab results.

## 2023-03-21 LAB — URINALYSIS W MICROSCOPIC + REFLEX CULTURE

## 2023-03-22 MED ORDER — ROSUVASTATIN CALCIUM 10 MG PO TABS: 10.0000 mg | ORAL_TABLET | Freq: Every day | ORAL | 3 refills | Status: DC

## 2023-03-22 NOTE — Addendum Note (Signed)
Addended by: Fanny Bien B on: 03/22/2023 12:47 PM   Modules accepted: Orders

## 2023-03-23 ENCOUNTER — Telehealth: Payer: Self-pay

## 2023-03-23 NOTE — Telephone Encounter (Signed)
Patient would like to know if he should be taking a Prostate medication  Please advise   Copied from CRM [id number]. Topic: Clinical - Medication Question >> Mar 23, 2023 10:19 AM Truddie Crumble wrote: Reason for CRM: Pt would like to  be called regarding medications that the doctor recently prescribed. Pt want to know should he be taking the meds

## 2023-03-26 MED ORDER — TAMSULOSIN HCL 0.4 MG PO CAPS: 0.4000 mg | ORAL_CAPSULE | Freq: Every day | ORAL | 3 refills | Status: DC

## 2023-03-26 NOTE — Telephone Encounter (Signed)
 Patient aware that Rx sent to pharmacy

## 2023-03-26 NOTE — Telephone Encounter (Signed)
Spoke with patient and he stated on his MyChart there was a mention of Tamsulosin, he wants to know if he should be taking it because it wasn't sent to the pharmacy.

## 2023-03-26 NOTE — Addendum Note (Signed)
Addended by: Fanny Bien B on: 03/26/2023 11:41 AM   Modules accepted: Orders

## 2023-03-30 ENCOUNTER — Emergency Department (HOSPITAL_COMMUNITY)
Admission: EM | Admit: 2023-03-30 | Discharge: 2023-03-31 | Disposition: A | Discharge status: Home / Self Care | Payer: Managed Care, Other (non HMO) | Attending: Emergency Medicine | Admitting: Emergency Medicine

## 2023-03-30 ENCOUNTER — Emergency Department (HOSPITAL_COMMUNITY): Payer: Managed Care, Other (non HMO)

## 2023-03-30 ENCOUNTER — Other Ambulatory Visit: Payer: Self-pay

## 2023-03-30 ENCOUNTER — Telehealth: Payer: Self-pay

## 2023-03-30 ENCOUNTER — Encounter (HOSPITAL_COMMUNITY): Payer: Self-pay

## 2023-03-30 DIAGNOSIS — Z20822 Contact with and (suspected) exposure to covid-19: Secondary | ICD-10-CM | POA: Diagnosis not present

## 2023-03-30 DIAGNOSIS — J209 Acute bronchitis, unspecified: Secondary | ICD-10-CM | POA: Insufficient documentation

## 2023-03-30 DIAGNOSIS — Z7982 Long term (current) use of aspirin: Secondary | ICD-10-CM | POA: Insufficient documentation

## 2023-03-30 DIAGNOSIS — R0789 Other chest pain: Secondary | ICD-10-CM | POA: Diagnosis present

## 2023-03-30 LAB — CBC WITH DIFFERENTIAL/PLATELET
Abs Immature Granulocytes: 0.03 10*3/uL (ref 0.00–0.07)
Basophils Absolute: 0.1 10*3/uL (ref 0.0–0.1)
Basophils Relative: 1 %
Eosinophils Absolute: 0.1 10*3/uL (ref 0.0–0.5)
Eosinophils Relative: 1 %
HCT: 40.1 % (ref 39.0–52.0)
Hemoglobin: 13 g/dL (ref 13.0–17.0)
Immature Granulocytes: 0 %
Lymphocytes Relative: 18 %
Lymphs Abs: 2 10*3/uL (ref 0.7–4.0)
MCH: 26.2 pg (ref 26.0–34.0)
MCHC: 32.4 g/dL (ref 30.0–36.0)
MCV: 80.7 fL (ref 80.0–100.0)
Monocytes Absolute: 0.9 10*3/uL (ref 0.1–1.0)
Monocytes Relative: 8 %
Neutro Abs: 8.1 10*3/uL — ABNORMAL HIGH (ref 1.7–7.7)
Neutrophils Relative %: 72 %
Platelets: 348 10*3/uL (ref 150–400)
RBC: 4.97 MIL/uL (ref 4.22–5.81)
RDW: 12.9 % (ref 11.5–15.5)
WBC: 11.2 10*3/uL — ABNORMAL HIGH (ref 4.0–10.5)
nRBC: 0 % (ref 0.0–0.2)

## 2023-03-30 LAB — COMPREHENSIVE METABOLIC PANEL
ALT: 24 U/L (ref 0–44)
AST: 26 U/L (ref 15–41)
Albumin: 3.7 g/dL (ref 3.5–5.0)
Alkaline Phosphatase: 69 U/L (ref 38–126)
Anion gap: 8 (ref 5–15)
BUN: 9 mg/dL (ref 6–20)
CO2: 24 mmol/L (ref 22–32)
Calcium: 8.6 mg/dL — ABNORMAL LOW (ref 8.9–10.3)
Chloride: 105 mmol/L (ref 98–111)
Creatinine, Ser: 0.98 mg/dL (ref 0.61–1.24)
GFR, Estimated: >60 mL/min (ref >60)
Glucose, Bld: 100 mg/dL — ABNORMAL HIGH (ref 70–99)
Potassium: 3.6 mmol/L (ref 3.5–5.1)
Sodium: 137 mmol/L (ref 135–145)
Total Bilirubin: 1.6 mg/dL — ABNORMAL HIGH (ref 0.0–1.2)
Total Protein: 7.1 g/dL (ref 6.5–8.1)

## 2023-03-30 LAB — TROPONIN I (HIGH SENSITIVITY): Troponin I (High Sensitivity): 5 ng/L (ref <18)

## 2023-03-30 LAB — D-DIMER, QUANTITATIVE: D-Dimer, Quant: 0.49 ug{FEU}/mL (ref 0.00–0.50)

## 2023-03-30 NOTE — Telephone Encounter (Signed)
Copied from CRM [id number]. Topic: Clinical - Medication Question >> Mar 30, 2023 12:18 PM Fonda Kinder J wrote: Reason for CRM: Pt wants to know if he could be called in a prescription to help with his Bronchitis, he states he told the provider he didn't need any at his last appointment but it has not improved since

## 2023-03-30 NOTE — ED Provider Notes (Incomplete)
[place] EMERGENCY DEPARTMENT AT [facility] Provider Note   CSN: [id number] Arrival date & time: 03/30/23  2124     History {Add pertinent medical, surgical, social history, OB history to HPI:1} Chief Complaint  Patient presents with  . Chest Pain    Peter Archer is a 58 y.o. male.  The history is provided by the patient.  Chest Pain    Home Medications Prior to Admission medications   Medication Sig Start Date End Date Taking? Authorizing Provider  Acetaminophen (8 HR ARTHRITIS PAIN RELIEF PO) Take 1 tablet by mouth daily at 6 (six) AM.    [provider]  aspirin EC 81 MG tablet Take 81 mg by mouth daily. Swallow whole.    [provider]  omeprazole (PRILOSEC) 40 MG capsule Take 1 capsule (40 mg total) by mouth daily. 12/25/22 12/20/23  Garnette Gunner, MD  rosuvastatin (CRESTOR) 10 MG tablet Take 1 tablet (10 mg total) by mouth daily. 03/22/23   Garnette Gunner, MD  tamsulosin (FLOMAX) 0.4 MG CAPS capsule Take 1 capsule (0.4 mg total) by mouth daily. 03/26/23 03/20/24  Garnette Gunner, MD      Allergies    Codeine    Review of Systems   Review of Systems  Cardiovascular:  Positive for chest pain.  All other systems reviewed and are negative.   Physical Exam Updated Vital Signs BP 107/85 (BP Location: Right Arm)   Pulse 74   Temp 98.7 F (37.1 C) (Oral)   Resp 19   Ht 5\' 5"  (1.651 m)   Wt 92.5 kg   SpO2 98%   BMI 33.95 kg/m  Physical Exam Vitals and nursing note reviewed.   58 year old male, resting comfortably and in no acute distress. Vital signs are ***. Oxygen saturation is ***%, which is normal. Head is normocephalic and atraumatic. PERRLA, EOMI. Oropharynx is clear. Neck is nontender and supple without adenopathy or JVD. Back is nontender and there is no CVA tenderness. Lungs are clear without rales, wheezes, or rhonchi. Chest is nontender. Heart has regular rate and rhythm without murmur. Abdomen is soft,  flat, nontender without masses or hepatosplenomegaly and peristalsis is normoactive. Extremities have no cyanosis or edema, full range of motion is present. Skin is warm and dry without rash. Neurologic: Mental status is normal, cranial nerves are intact, there are no motor or sensory deficits.  ED Results / Procedures / Treatments   Labs (all labs ordered are listed, but only abnormal results are displayed) Labs Reviewed  CBC WITH DIFFERENTIAL/PLATELET - Abnormal; Notable for the following components:      Result Value   WBC 11.2 (*)    Neutro Abs 8.1 (*)    All other components within normal limits  COMPREHENSIVE METABOLIC PANEL - Abnormal; Notable for the following components:   Glucose, Bld 100 (*)    Calcium 8.6 (*)    Total Bilirubin 1.6 (*)    All other components within normal limits  D-DIMER, QUANTITATIVE  TROPONIN I (HIGH SENSITIVITY)  TROPONIN I (HIGH SENSITIVITY)    EKG EKG Interpretation Date/Time:  Friday March 30 2023 21:36:54 EST Ventricular Rate:  79 PR Interval:  138 QRS Duration:  88 QT Interval:  374 QTC Calculation: 428 R Axis:   1  Text Interpretation: Normal sinus rhythm Minimal voltage criteria for LVH, may be normal variant ( R in aVL ) T wave abnormality, consider inferior ischemia Abnormal ECG No significant change since last tracing Confirmed  by Elayne Snare [id number]) on 03/30/2023 10:57:41 PM  Radiology DG Chest 2 View Result Date: 03/30/2023 CLINICAL DATA:  Chest pain EXAM: CHEST - 2 VIEW COMPARISON:  11/07/2020 FINDINGS: Cardiac shadow is enlarged but stable. Lungs are well aerated bilaterally. No focal infiltrate or effusion is seen. No bony abnormality is noted. Chronic rim calcified lesion in the left upper quadrant is noted and stable from the prior exam. IMPRESSION: No active cardiopulmonary disease. Electronically Signed   By: Alcide Clever M.D.   On: 03/30/2023 22:17    Procedures Procedures  {Document cardiac monitor, telemetry  assessment procedure when appropriate:1}  Medications Ordered in ED Medications - No data to display  ED Course/ Medical Decision Making/ A&P   {   Click here for ABCD2, HEART and other calculatorsREFRESH Note before signing :1}                              Medical Decision Making  ***  {Document critical care time when appropriate:1} {Document review of labs and clinical decision tools ie heart score, Chads2Vasc2 etc:1}  {Document your independent review of radiology images, and any outside records:1} {Document your discussion with family members, caretakers, and with consultants:1} {Document social determinants of health affecting pt's care:1} {Document your decision making why or why not admission, treatments were needed:1} Final Clinical Impression(s) / ED Diagnoses Final diagnoses:  None    Rx / DC Orders ED Discharge Orders     None

## 2023-03-30 NOTE — Telephone Encounter (Signed)
I called and spoke with patient and he said that he has bronchitis and there is no documentation in his last office visit of this. Patient said that he is working in [place] and can not come in to be seen.  I told patient that he might want to go to his nearest urgent care to be evaluated. He agreed.

## 2023-03-30 NOTE — ED Triage Notes (Signed)
Pt arrived from home via POV c/o chest pain pressure 10/10 on pain scale accompanied with sob since 03/29/2023 at noon

## 2023-03-30 NOTE — ED Provider Triage Note (Signed)
Emergency Medicine Provider Triage Evaluation Note  Peter Archer , a 58 y.o. male  was evaluated in triage.  Pt complains of chest pain that started yesterday.  States the pain is on the front of his chest, no radiation pain to the back, arm, or jaw.  Pain worse with deep inspiration and also worse with movement.  No nausea or vomiting.  Reports associated shortness of breath.  Also had some pain in his right calf last night, but thought this was a charley horse.  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  Review of Systems  Positive: As above Negative: As above  Physical Exam  BP (!) 130/90   Pulse 84   Temp 98.4 F (36.9 C)   Resp 18   SpO2 100%  Gen:   Awake, uncomfortable appearing, holding chest Resp:  Normal effort  MSK:   Moves extremities without difficulty    Medical Decision Making  Medically screening exam initiated at 9:44 PM.  Appropriate orders placed.  Peter Archer was informed that the remainder of the evaluation will be completed by another provider, this initial triage assessment does not replace that evaluation, and the importance of remaining in the ED until their evaluation is complete.     Arabella Merles, PA-C 03/30/23 2144

## 2023-03-30 NOTE — ED Provider Notes (Signed)
[place] EMERGENCY DEPARTMENT AT [facility] Provider Note   CSN: [id number] Arrival date & time: 03/30/23  2124     History  Chief Complaint  Patient presents with   Chest Pain    Peter Archer is a 58 y.o. male.  The history is provided by the patient.  Chest Pain He has history of GERD, hyperlipidemia and comes in because of tightness in his chest which started yesterday and is getting worse.  There has been minimal cough.  He denies fever, chills, sweats.  He denies nausea, vomiting.  He does have a history of having had bronchitis and this feels similar.  He is a non-smoker and denies history of hypertension or diabetes.   Home Medications Prior to Admission medications   Medication Sig Start Date End Date Taking? Authorizing Provider  Acetaminophen (8 HR ARTHRITIS PAIN RELIEF PO) Take 1 tablet by mouth daily at 6 (six) AM.    [provider]  aspirin EC 81 MG tablet Take 81 mg by mouth daily. Swallow whole.    [provider]  omeprazole (PRILOSEC) 40 MG capsule Take 1 capsule (40 mg total) by mouth daily. 12/25/22 12/20/23  Garnette Gunner, MD  rosuvastatin (CRESTOR) 10 MG tablet Take 1 tablet (10 mg total) by mouth daily. 03/22/23   Garnette Gunner, MD  tamsulosin (FLOMAX) 0.4 MG CAPS capsule Take 1 capsule (0.4 mg total) by mouth daily. 03/26/23 03/20/24  Garnette Gunner, MD      Allergies    Codeine    Review of Systems   Review of Systems  Cardiovascular:  Positive for chest pain.  All other systems reviewed and are negative.   Physical Exam Updated Vital Signs BP 107/85 (BP Location: Right Arm)   Pulse 74   Temp 98.7 F (37.1 C) (Oral)   Resp 19   Ht 5\' 5"  (1.651 m)   Wt 92.5 kg   SpO2 98%   BMI 33.95 kg/m  Physical Exam Vitals and nursing note reviewed.   58 year old male, resting comfortably and in no acute distress. Vital signs are normal. Oxygen saturation is 96%, which is normal. Head is normocephalic and  atraumatic. PERRLA, EOMI. Oropharynx is clear. Neck is nontender and supple without adenopathy. Lungs are clear without rales, wheezes, or rhonchi.  There is a slightly prolonged exhalation phase, wheezing is noted with forced exhalation. Chest is nontender. Heart has regular rate and rhythm without murmur. Abdomen is soft, flat, nontender. Extremities have no cyanosis or edema, full range of motion is present. Skin is warm and dry without rash. Neurologic: Mental status is normal, cranial nerves are intact, moves all extremities equally.  ED Results / Procedures / Treatments   Labs (all labs ordered are listed, but only abnormal results are displayed) Labs Reviewed  CBC WITH DIFFERENTIAL/PLATELET - Abnormal; Notable for the following components:      Result Value   WBC 11.2 (*)    Neutro Abs 8.1 (*)    All other components within normal limits  COMPREHENSIVE METABOLIC PANEL - Abnormal; Notable for the following components:   Glucose, Bld 100 (*)    Calcium 8.6 (*)    Total Bilirubin 1.6 (*)    All other components within normal limits  D-DIMER, QUANTITATIVE  TROPONIN I (HIGH SENSITIVITY)  TROPONIN I (HIGH SENSITIVITY)    EKG EKG Interpretation Date/Time:  Friday March 30 2023 21:36:54 EST Ventricular Rate:  79 PR Interval:  138 QRS Duration:  88  QT Interval:  374 QTC Calculation: 428 R Axis:   1  Text Interpretation: Normal sinus rhythm Minimal voltage criteria for LVH, may be normal variant ( R in aVL ) T wave abnormality, consider inferior ischemia Abnormal ECG No significant change since last tracing Confirmed by Elayne Snare ([id number]) on 03/30/2023 10:57:41 PM  Radiology DG Chest 2 View Result Date: 03/30/2023 CLINICAL DATA:  Chest pain EXAM: CHEST - 2 VIEW COMPARISON:  11/07/2020 FINDINGS: Cardiac shadow is enlarged but stable. Lungs are well aerated bilaterally. No focal infiltrate or effusion is seen. No bony abnormality is noted. Chronic rim calcified lesion in  the left upper quadrant is noted and stable from the prior exam. IMPRESSION: No active cardiopulmonary disease. Electronically Signed   By: Alcide Clever M.D.   On: 03/30/2023 22:17    Procedures Procedures  Cardiac monitor shows normal sinus rhythm, per my interpretation.  Medications Ordered in ED Medications  albuterol (VENTOLIN HFA) 108 (90 Base) MCG/ACT inhaler 2 puff (2 puffs Inhalation Given 03/31/23 0203)  ipratropium-albuterol (DUONEB) 0.5-2.5 (3) MG/3ML nebulizer solution 3 mL (3 mLs Nebulization Given 03/31/23 0026)  ipratropium-albuterol (DUONEB) 0.5-2.5 (3) MG/3ML nebulizer solution 3 mL (3 mLs Nebulization Given 03/31/23 0153)  predniSONE (DELTASONE) tablet 60 mg (60 mg Oral Given 03/31/23 0153)    ED Course/ Medical Decision Making/ A&P                                 Medical Decision Making Risk Prescription drug management.   Chest tightness which appears to be related to bronchospasm based on physical exam.  Consider bronchitis, pneumonia.  Doubt ACS, pulmonary embolism.  Chest x-ray shows no evidence of pneumonia.  Have independently viewed the images, and agree with the radiologist's interpretation.  I have reviewed his electrocardiogram, and my interpretation is left ventricular hypertrophy with secondary T wave changes unchanged from prior.  I have reviewed his laboratory tests, and my interpretation is mildly elevated total bilirubin and otherwise normal comprehensive metabolic panel, normal troponin, mild leukocytosis which is nonspecific, normal D-dimer.  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  Respiratory pathogen panel is pending.  I have ordered a nebulizer treatment with albuterol and ipratropium.  He had good relief of symptoms with nebulizer treatment.  I have ordered a dose of prednisone.  I am discharging him with an albuterol inhaler and a prescription for a 5-day course of prednisone.  Final Clinical  Impression(s) / ED Diagnoses Final diagnoses:  Acute bronchitis, unspecified organism    Rx / DC Orders ED Discharge Orders          Ordered    predniSONE (DELTASONE) 50 MG tablet  Daily        03/31/23 0126              Dione Booze, MD 03/31/23 [phone]

## 2023-03-30 NOTE — ED Notes (Addendum)
The pt  is c/o pain in his chest headache  pain in his rt calf no temp  aching all over his body  since yesterday a and o x 4  no distress

## 2023-03-31 LAB — RESP PANEL BY RT-PCR (RSV, FLU A&B, COVID)  RVPGX2
Influenza A by PCR: NEGATIVE
Influenza B by PCR: NEGATIVE
Resp Syncytial Virus by PCR: NEGATIVE
SARS Coronavirus 2 by RT PCR: NEGATIVE

## 2023-03-31 LAB — TROPONIN I (HIGH SENSITIVITY): Troponin I (High Sensitivity): 5 ng/L (ref <18)

## 2023-03-31 MED ORDER — IPRATROPIUM-ALBUTEROL 0.5-2.5 (3) MG/3ML IN SOLN
3.0000 mL | Freq: Once | RESPIRATORY_TRACT | Status: AC
  Administered 2023-03-31: 3 mL via RESPIRATORY_TRACT
  Filled 2023-03-31: qty 3

## 2023-03-31 MED ORDER — PREDNISONE 20 MG PO TABS
60.0000 mg | ORAL_TABLET | Freq: Once | ORAL | Status: AC
  Administered 2023-03-31: 60 mg via ORAL
  Filled 2023-03-31: qty 3

## 2023-03-31 MED ORDER — PREDNISONE 50 MG PO TABS
50.0000 mg | ORAL_TABLET | Freq: Every day | ORAL | 0 refills | Status: DC
Start: 2023-03-31 — End: 2023-10-19

## 2023-03-31 MED ORDER — ALBUTEROL SULFATE HFA 108 (90 BASE) MCG/ACT IN AERS
2.0000 | INHALATION_SPRAY | RESPIRATORY_TRACT | Status: DC | PRN
  Administered 2023-03-31: 2 via RESPIRATORY_TRACT
  Filled 2023-03-31: qty 6.7

## 2023-03-31 NOTE — Discharge Instructions (Signed)
Use the inhaler-2 puffs at a time, every 4 hours as needed for cough or difficulty breathing.  Drink plenty of fluids.  Take acetaminophen and/or ibuprofen as needed if you have any fever or aching.  Return if symptoms are getting worse.

## 2023-04-09 ENCOUNTER — Ambulatory Visit (INDEPENDENT_AMBULATORY_CARE_PROVIDER_SITE_OTHER): Payer: Managed Care, Other (non HMO) | Admitting: Urology

## 2023-04-09 ENCOUNTER — Encounter: Payer: Self-pay | Admitting: Urology

## 2023-04-09 VITALS — BP 121/84 | HR 73 | Ht 66.0 in | Wt 204.0 lb

## 2023-04-09 DIAGNOSIS — N419 Inflammatory disease of prostate, unspecified: Secondary | ICD-10-CM | POA: Insufficient documentation

## 2023-04-09 DIAGNOSIS — R972 Elevated prostate specific antigen [PSA]: Secondary | ICD-10-CM | POA: Insufficient documentation

## 2023-04-09 LAB — URINALYSIS, ROUTINE W REFLEX MICROSCOPIC
Bilirubin, UA: NEGATIVE
Glucose, UA: NEGATIVE
Ketones, UA: NEGATIVE
Leukocytes,UA: NEGATIVE
Nitrite, UA: NEGATIVE
Protein,UA: NEGATIVE
RBC, UA: NEGATIVE
Specific Gravity, UA: 1.015 (ref 1.005–1.030)
Urobilinogen, Ur: 0.2 mg/dL (ref 0.2–1.0)
pH, UA: 7 (ref 5.0–7.5)

## 2023-04-09 MED ORDER — SULFAMETHOXAZOLE-TRIMETHOPRIM 800-160 MG PO TABS: 1.0000 | ORAL_TABLET | Freq: Two times a day (BID) | ORAL | 0 refills | Status: AC

## 2023-04-09 NOTE — Progress Notes (Signed)
Assessment: 1. Elevated PSA   2. Prostatitis, unspecified prostatitis type     Plan: I personally reviewed the patient's chart including provider notes, and lab results. Today I had a long discussion with the patient regarding PSA and the rationale and controversies of prostate cancer early detection.  I discussed the pros and cons of further evaluation including TRUS and prostate Bx.  Potential adverse events and complications as well as standard instructions were given.  Patient expressed his understanding of these issues. I discussed the association of elevated PSA with prostatitis. Recommend a course of Bactrim DS twice daily x 21 days for treatment of prostatitis. Return to office in 6 weeks.   Will need repeat PSA after resolution of prostatitis.  Chief Complaint:  Chief Complaint  Patient presents with   Elevated PSA    History of Present Illness:  Peter Archer is a 58 y.o. male who is seen in consultation from Garnette Gunner, MD for evaluation of elevated PSA. PSA results: 12/20 2.47 1/25 6.23  No prior history of elevated PSA.  No known history of prostatitis or UTIs.  No family history of prostate cancer. He reports nocturia x 2 but no other lower urinary tract symptoms.  No dysuria or gross hematuria. He was started on tamsulosin approximately 1 week ago. IPSS = 7 today.  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   COLONOSCOPY  04/2019   INGUINAL HERNIA REPAIR Bilateral 11/27/2019   Procedure: LAPAROSCOPIC INGUINAL HERNIA WITH MESH;  Surgeon: Kinsinger, De Blanch, MD;  Location: [facility];  Service: General;  Laterality: Bilateral;   UMBILICAL HERNIA REPAIR N/A 11/27/2019   Procedure: OPEN HERNIA REPAIR UMBILICAL WITH MESH;  Surgeon: Kinsinger, De Blanch, MD;  Location: [facility];  Service: General;  Laterality: N/A;    Allergies:  Allergies  Allergen Reactions   Codeine Anaphylaxis    Family History:  Family History  Problem Relation Age of Onset   Heart disease Father    Stroke Paternal Grandmother    Cancer Paternal Grandmother        colon   Colon cancer Neg Hx    Colon polyps Neg Hx    Esophageal cancer Neg Hx    Stomach cancer Neg Hx    Rectal cancer Neg Hx     Social History:  Social History   Tobacco Use   Smoking status: Never    Passive exposure: Never   Smokeless tobacco: Never  Vaping Use   Vaping status: Never Used  Substance Use Topics   Alcohol use: Yes    Alcohol/week: 2.0 standard drinks of alcohol    Types: 2 Standard drinks or equivalent per week    Comment: occasional   Drug use: Never    Review of symptoms:  Constitutional:  Negative for unexplained weight loss, night sweats, fever, chills ENT:  Negative for nose bleeds, sinus pain, painful swallowing CV:  Negative for chest pain, shortness of breath, exercise intolerance, palpitations, loss of consciousness Resp:  Negative for cough, wheezing, shortness of breath GI:  Negative for nausea, vomiting, diarrhea, bloody stools GU:  Positives noted in HPI; otherwise negative for gross hematuria, dysuria, urinary incontinence Neuro:  Negative for seizures, poor balance, limb weakness, slurred speech Psych:  Negative for lack of energy, depression, anxiety Endocrine:  Negative for polydipsia, polyuria, symptoms of hypoglycemia  (dizziness, hunger, sweating) Hematologic:  Negative for anemia, purpura, petechia, prolonged or excessive bleeding, use of anticoagulants  Allergic:  Negative for difficulty breathing or choking as a result of exposure to anything; no shellfish allergy; no allergic response (rash/itch) to materials, foods  Physical exam: BP 121/84   Pulse 73   Ht 5\' 6"  (1.676 m)   Wt 204 lb (92.5 kg)   BMI 32.93 kg/m  GENERAL APPEARANCE:  Well appearing, well developed, well nourished, NAD HEENT: Atraumatic, Normocephalic, oropharynx clear. NECK: Supple without lymphadenopathy or thyromegaly. LUNGS: Clear to auscultation bilaterally. HEART: Regular Rate and Rhythm without murmurs, gallops, or rubs. ABDOMEN: Soft, non-tender, No Masses. EXTREMITIES: Moves all extremities well.  Without clubbing, cyanosis, or edema. NEUROLOGIC:  Alert and oriented x 3, normal gait, CN II-XII grossly intact.  MENTAL STATUS:  Appropriate. BACK:  Non-tender to palpation.  No CVAT SKIN:  Warm, dry and intact.   GU: Penis:  circumcised Meatus: Normal Scrotum: normal, no masses Testis: normal without masses bilateral Prostate: 30 g, tender to palpation Rectum: Normal tone,  no masses or tenderness   Results: U/A: negative

## 2023-05-21 ENCOUNTER — Encounter: Payer: Self-pay | Admitting: Urology

## 2023-05-21 ENCOUNTER — Ambulatory Visit (INDEPENDENT_AMBULATORY_CARE_PROVIDER_SITE_OTHER): Payer: Managed Care, Other (non HMO) | Admitting: Urology

## 2023-05-21 VITALS — BP 137/90 | HR 62 | Ht 68.0 in | Wt 204.0 lb

## 2023-05-21 DIAGNOSIS — N419 Inflammatory disease of prostate, unspecified: Secondary | ICD-10-CM

## 2023-05-21 DIAGNOSIS — Z87438 Personal history of other diseases of male genital organs: Secondary | ICD-10-CM | POA: Diagnosis not present

## 2023-05-21 DIAGNOSIS — R972 Elevated prostate specific antigen [PSA]: Secondary | ICD-10-CM | POA: Diagnosis not present

## 2023-05-21 LAB — URINALYSIS, ROUTINE W REFLEX MICROSCOPIC
Bilirubin, UA: NEGATIVE
Glucose, UA: NEGATIVE
Ketones, UA: NEGATIVE
Leukocytes,UA: NEGATIVE
Nitrite, UA: NEGATIVE
Protein,UA: NEGATIVE
RBC, UA: NEGATIVE
Specific Gravity, UA: 1.02 (ref 1.005–1.030)
Urobilinogen, Ur: 0.2 mg/dL (ref 0.2–1.0)
pH, UA: 5.5 (ref 5.0–7.5)

## 2023-05-21 NOTE — Addendum Note (Signed)
 Addended by: Milderd Meager on: 05/21/2023 10:28 AM   Modules accepted: Orders

## 2023-05-21 NOTE — Progress Notes (Addendum)
 Assessment: 1. Elevated PSA   2. Prostatitis, unspecified prostatitis type     Plan: PSA today - will contact him with results  Chief Complaint:  Chief Complaint  Patient presents with   Elevated PSA    History of Present Illness:  Peter Archer is a 58 y.o. male who is seen for further evaluation of elevated PSA. PSA results: 12/20 2.47 1/25 6.23  No prior history of elevated PSA.  No known history of prostatitis or UTIs.  No family history of prostate cancer. He reports nocturia x 2 but no other lower urinary tract symptoms.  No dysuria or gross hematuria. He was started on tamsulosin approximately 1 week prior to his visit on 04/09/23. IPSS = 7. His exam was consistent with prostatitis.  He was treated with Bactrim DS x 21 days.  He returns today for follow-up.  He has completed the Bactrim as prescribed.  He has noted improvement in his urinary symptoms.  No dysuria or gross hematuria.  He discontinued the tamsulosin after his last visit. IPSS = 2/2. He report occasional difficulty maintaining his erection.  This occurs about once a month.  He has previously tried tadalafil without improvement.  Portions of the above documentation were copied from a prior visit for review purposes only.   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   COLONOSCOPY  04/2019   INGUINAL HERNIA REPAIR  Bilateral 11/27/2019   Procedure: LAPAROSCOPIC INGUINAL HERNIA WITH MESH;  Surgeon: Kinsinger, De Blanch, MD;  Location: [facility];  Service: General;  Laterality: Bilateral;   UMBILICAL HERNIA REPAIR N/A 11/27/2019   Procedure: OPEN HERNIA REPAIR UMBILICAL WITH MESH;  Surgeon: Kinsinger, De Blanch, MD;  Location: [facility];  Service: General;  Laterality: N/A;    Allergies:  Allergies  Allergen Reactions   Codeine Anaphylaxis    Family History:  Family History  Problem Relation Age of Onset   Heart disease Father    Stroke Paternal Grandmother    Cancer Paternal Grandmother        colon   Colon cancer Neg Hx    Colon polyps Neg Hx    Esophageal cancer Neg Hx    Stomach cancer Neg Hx    Rectal cancer Neg Hx     Social History:  Social History   Tobacco Use   Smoking status: Never    Passive exposure: Never   Smokeless tobacco: Never  Vaping Use   Vaping status: Never Used  Substance Use Topics   Alcohol use: Yes    Alcohol/week: 2.0 standard drinks of alcohol    Types: 2 Standard drinks or equivalent per week    Comment: occasional   Drug use: Never    ROS: Constitutional:  Negative for fever, chills, weight loss CV: Negative for  chest pain, previous MI, hypertension Respiratory:  Negative for shortness of breath, wheezing, sleep apnea, frequent cough GI:  Negative for nausea, vomiting, bloody stool, GERD  Physical exam: BP (!) 137/90   Pulse 62   Ht 5\' 8"  (1.727 m)   Wt 204 lb (92.5 kg)   BMI 31.02 kg/m  GENERAL APPEARANCE:  Well appearing, well developed, well nourished, NAD HEENT:  Atraumatic, normocephalic, oropharynx clear NECK:  Supple without lymphadenopathy or thyromegaly ABDOMEN:  Soft, non-tender, no masses EXTREMITIES:  Moves all extremities well, without clubbing, cyanosis, or edema NEUROLOGIC:  Alert and oriented x 3, normal gait, CN II-XII grossly intact MENTAL STATUS:  appropriate BACK:  Non-tender to  palpation, No CVAT SKIN:  Warm, dry, and intact   Results: U/A: negative

## 2023-05-22 LAB — PSA: Prostate Specific Ag, Serum: 5.6 ng/mL — ABNORMAL HIGH (ref 0.0–4.0)

## 2023-05-23 ENCOUNTER — Encounter: Payer: Self-pay | Admitting: Urology

## 2023-05-23 NOTE — Addendum Note (Signed)
 Addended by: Milderd Meager on: 05/23/2023 05:02 PM   Modules accepted: Orders

## 2023-06-25 ENCOUNTER — Telehealth: Payer: Self-pay

## 2023-06-25 NOTE — Transitions of Care (Post Inpatient/ED Visit) (Signed)
   06/25/2023  Name: Peter Archer MRN: [id number] DOB: 28-Dec-1965  Today's TOC FU Call Status: Today's TOC FU Call Status:: Successful TOC FU Call Completed TOC FU Call Complete Date: 06/25/23 Patient's Name and Date of Birth confirmed.  Transition Care Management Follow-up Telephone Call Date of Discharge: 06/21/23 Discharge Facility: Other [facility]) Name of Other (Non-[facility]) Discharge Facility: [facility] Type of Discharge: Emergency Department Reason for ED Visit: Other: (bronchitits) How have you been since you were released from the hospital?: Better Any questions or concerns?: No  Items Reviewed: Did you receive and understand the discharge instructions provided?: Yes Medications obtained,verified, and reconciled?: Yes (Medications Reviewed) Any new allergies since your discharge?: No Dietary orders reviewed?: NA Do you have support at home?: No  Medications Reviewed Today: Medications Reviewed Today     Reviewed by Lonie Roa, CMA (Certified Medical Assistant) on 06/25/23 at 1458  Med List Status: <None>   Medication Order Taking? Sig Documenting Provider Last Dose Status Informant  Acetaminophen  (8 HR ARTHRITIS PAIN RELIEF PO) [id number] Yes Take 1 tablet by mouth daily at 6 (six) AM. [provider] Taking Active   albuterol  (PROVENTIL ) (2.5 MG/3ML) 0.083% nebulizer solution [id number] Yes 2.5 mg. [provider] Taking Active   aspirin EC 81 MG tablet [id number] Yes Take 81 mg by mouth daily. Swallow whole. [provider] Taking Active   Misc. Devices MISC [id number] Yes Nebulizer machine to be used as needed. [provider] Taking Active   omeprazole  (PRILOSEC) 40 MG capsule [id number] Yes Take 1 capsule (40 mg total) by mouth daily. Catheryn Cluck, MD Taking Active   predniSONE  (DELTASONE ) 50 MG tablet [id number] Yes Take 1 tablet (50 mg total) by mouth daily. Alissa April, MD Taking Active    rosuvastatin  (CRESTOR ) 10 MG tablet [id number] Yes Take 1 tablet (10 mg total) by mouth daily. Catheryn Cluck, MD Taking Active             Home Care and Equipment/Supplies: Were Home Health Services Ordered?: NA Any new equipment or medical supplies ordered?: NA  Functional Questionnaire: Do you need assistance with bathing/showering or dressing?: No Do you need assistance with meal preparation?: No Do you need assistance with eating?: No Do you have difficulty maintaining continence: No Do you need assistance with getting out of bed/getting out of a chair/moving?: No Do you have difficulty managing or taking your medications?: No  Follow up appointments reviewed: PCP Follow-up appointment confirmed?: No (Pt declined at this time) MD Provider Line [phone] Given: Yes Specialist Hospital Follow-up appointment confirmed?: NA Do you need transportation to your follow-up appointment?: No Do you understand care options if your condition(s) worsen?: Yes-patient verbalized understanding    SIGNATURE Kirby Peoples, RMA

## 2023-06-29 ENCOUNTER — Other Ambulatory Visit

## 2023-10-19 ENCOUNTER — Ambulatory Visit: Admitting: Family Medicine

## 2023-10-19 ENCOUNTER — Encounter: Payer: Self-pay | Admitting: Family Medicine

## 2023-10-19 VITALS — BP 136/89 | HR 80 | Temp 97.8°F | Resp 18 | Wt 195.6 lb

## 2023-10-19 DIAGNOSIS — N419 Inflammatory disease of prostate, unspecified: Secondary | ICD-10-CM

## 2023-10-19 DIAGNOSIS — Z23 Encounter for immunization: Secondary | ICD-10-CM

## 2023-10-19 DIAGNOSIS — K219 Gastro-esophageal reflux disease without esophagitis: Secondary | ICD-10-CM | POA: Diagnosis not present

## 2023-10-19 DIAGNOSIS — E782 Mixed hyperlipidemia: Secondary | ICD-10-CM

## 2023-10-19 DIAGNOSIS — M19011 Primary osteoarthritis, right shoulder: Secondary | ICD-10-CM

## 2023-10-19 DIAGNOSIS — M19012 Primary osteoarthritis, left shoulder: Secondary | ICD-10-CM

## 2023-10-19 LAB — PSA: PSA: 5.89 ng/mL — ABNORMAL HIGH (ref 0.10–4.00)

## 2023-10-19 LAB — MICROALBUMIN / CREATININE URINE RATIO
Creatinine,U: 195.9 mg/dL
Microalb Creat Ratio: UNDETERMINED mg/g (ref 0.0–30.0)
Microalb, Ur: <0.7 mg/dL

## 2023-10-19 MED ORDER — CELECOXIB 200 MG PO CAPS: 200.0000 mg | ORAL_CAPSULE | Freq: Two times a day (BID) | ORAL | 11 refills | Status: AC | PRN

## 2023-10-19 MED ORDER — ROSUVASTATIN CALCIUM 10 MG PO TABS: 10.0000 mg | ORAL_TABLET | Freq: Every day | ORAL | 3 refills | Status: AC

## 2023-10-19 MED ORDER — OMEPRAZOLE 40 MG PO CPDR: 40.0000 mg | DELAYED_RELEASE_CAPSULE | Freq: Every day | ORAL | 3 refills | Status: AC

## 2023-10-19 NOTE — Progress Notes (Signed)
 Assessment & Plan   Assessment/Plan:    Assessment & Plan Elevated PSA with history of prostatic inflammation PSA level at 6.7, previously treated with finasteride. No MRI or biopsy performed. Symptoms include nocturia once per night, no urinary tract infection symptoms or other urinary issues. Discussed PSA limitations and factors affecting levels, including inflammation, infection, or benign prostatic hyperplasia. - Recheck PSA level today - Obtain urine sample to check for blood, infection, or other abnormalities - If PSA remains elevated, refer to urology for further evaluation - If PSA decreases, continue monitoring in the office  Hyperlipidemia 10% risk of cardiovascular events in the next ten years. Currently on rosuvastatin , but ran out of medication. No adverse effects reported. Discussed medication adherence and lifestyle modifications to manage cholesterol levels and reduce cardiovascular risk. - Refill rosuvastatin  prescription - Schedule fasting blood work for cholesterol re-evaluation in one week - Continue lifestyle modifications including diet and exercise  Gastroesophageal reflux disease (GERD) Long-standing GERD managed with omeprazole . No new symptoms or complications. Discussed acid reflux mechanism and omeprazole 's role in reducing stomach acid production. - Continue omeprazole  40 mg daily   The 10-year ASCVD risk score (Arnett DK, et al., 2019) is: 10%   Values used to calculate the score:     Age: 22 years     Clincally relevant sex: Male     Is Non-Hispanic African American: No     Diabetic: No     Tobacco smoker: No     Systolic Blood Pressure: 136 mmHg     Is BP treated: No     HDL Cholesterol: 38.1 mg/dL     Total Cholesterol: 185 mg/dL    There are no discontinued medications.  No follow-ups on file.        Subjective:   Encounter date: 10/19/2023  Peter Archer is a 58 y.o. male who has Closed nondisplaced fracture of distal phalanx  of left middle finger; GERD (gastroesophageal reflux disease); Seasonal allergies; Cough present for greater than 3 weeks; Postprandial RUQ pain; Splenic cyst; Epistaxis not due to trauma; Frequent urination; Moderate mixed hyperlipidemia not requiring statin therapy; Class 1 obesity due to excess calories without serious comorbidity with body mass index (BMI) of 33.0 to 33.9 in adult; Encounter for well adult exam without abnormal findings; Prostatitis; and Elevated PSA on their problem list..   He  has a past medical history of Abnormal CT of the abdomen, Arthritis, Bilateral inguinal hernia, Environmental and seasonal allergies, GERD (gastroesophageal reflux disease), History of 2019 novel coronavirus disease (COVID-19), and Umbilical hernia.SABRA   He presents with chief complaint of Hyperlipidemia (Pt is not fasting today and request a cholesterol and PSA levels checked. //HM due- prevnar 20 and Hep B vaccine. ) .   Discussed the use of AI scribe software for clinical note transcription with the patient, who gave verbal consent to proceed.  History of Present Illness Peter Archer is a 58 year old male who presents for follow-up of elevated PSA and cholesterol management.  He has a history of elevated PSA, previously measured at 6.7. He was referred to a urologist in [facility] and was prescribed finasteride for three weeks. Due to a lack of insurance, he did not follow up for a recheck of his PSA levels. No current urinary symptoms, such as difficulty urinating or increased frequency, are reported, and he typically wakes once per night to urinate. He denies undergoing an MRI or biopsy.  He is managing hyperlipidemia, with labs in January  indicating a 10% risk of heart attack or stroke over the next ten years, prompting the initiation of rosuvastatin . He ran out of the medication until he could refill his prescription.  He takes omeprazole  daily for acid reflux, which he has had for years. He  describes receiving a large box of the medication and continues to take it regularly. He recalls being told he has a condition in his stomach that causes acid reflux but does not specify further details. No recent symptoms such as abdominal pain or blood in his urine are reported.  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 He grew up in [place] and is now back in town.     ROS  Past Surgical History:  Procedure Laterality Date  . APPENDECTOMY  1985 approx.  . COLONOSCOPY  04/2019  . INGUINAL HERNIA REPAIR Bilateral 11/27/2019   Procedure: LAPAROSCOPIC INGUINAL HERNIA WITH MESH;  Surgeon: Kinsinger, Herlene Righter, MD;  Location: [facility] [place];  Service: General;  Laterality: Bilateral;  . UMBILICAL HERNIA REPAIR N/A 11/27/2019   Procedure: OPEN HERNIA REPAIR UMBILICAL WITH MESH;  Surgeon: Kinsinger, Herlene Righter, MD;  Location: [facility] ;  Service: General;  Laterality: N/A;    Outpatient Medications Prior to Visit  Medication Sig Dispense Refill  . Acetaminophen  (8 HR ARTHRITIS PAIN RELIEF PO) Take 1 tablet by mouth daily at 6 (six) AM.    . aspirin EC 81 MG tablet Take 81 mg by mouth daily. Swallow whole.    . Misc. Devices MISC Nebulizer machine to be used as needed.    . omeprazole  (PRILOSEC) 40 MG capsule Take 1 capsule (40 mg total) by mouth daily. 90 capsule 3  . rosuvastatin  (CRESTOR ) 10 MG tablet Take 1 tablet (10 mg total) by mouth daily. 90 tablet 3  . predniSONE  (DELTASONE ) 50 MG tablet Take 1 tablet (50 mg total) by mouth daily. (Patient not taking: Reported on 10/19/2023) 5 tablet 0   No facility-administered medications prior to visit.    Family History  Problem Relation Age of Onset  . Heart disease Father   . Stroke Paternal Grandmother   . Cancer Paternal Grandmother        colon  . Colon cancer Neg Hx   . Colon polyps Neg Hx   . Esophageal  cancer Neg Hx   . Stomach cancer Neg Hx   . Rectal cancer Neg Hx     Social History   Socioeconomic History  . Marital status: Divorced    Spouse name: Not on file  . Number of children: Not on file  . Years of education: Not on file  . Highest education level: Not on file  Occupational History  . Not on file  Tobacco Use  . Smoking status: Never    Passive exposure: Never  . Smokeless tobacco: Never  Vaping Use  . Vaping status: Never Used  Substance and Sexual Activity  . Alcohol use: Yes    Alcohol/week: 2.0 standard drinks of alcohol    Types: 2 Standard drinks or equivalent per week    Comment: occasional  . Drug use: Never  . Sexual activity: Yes  Other Topics Concern  . Not on file  Social History Narrative  . Not on file   Social Drivers of Health   Financial Resource Strain: Not on file  Food Insecurity: Not on file  Transportation Needs: Not on file  Physical Activity: Not on file  Stress: Not on file  Social Connections: Not on file  Intimate Partner Violence: Not At Risk (06/21/2023)   [facility]   HITS   . Over the last 12 months how often did your partner physically hurt you?: Never   . Over the last 12 months how often did your partner insult you or talk down to you?: Never   . Over the last 12 months how often did your partner threaten you with physical harm?: Never   . Over the last 12 months how often did your partner scream or curse at you?: Never                                                                                                  Objective:  Physical Exam: BP 136/89 (BP Location: Left Arm, Patient Position: Sitting, Cuff Size: Large)   Pulse 80   Temp 97.8 F (36.6 C) (Temporal)   Resp 18   Wt 195 lb 9.6 oz (88.7 kg)   SpO2 98%   BMI 29.74 kg/m    Physical Exam VITALS: BP- 136/89 GENERAL: Alert, cooperative, well developed, no acute distress HEENT: Normocephalic, normal oropharynx, moist mucous  membranes CHEST: Clear to auscultation bilaterally, No wheezes, rhonchi, or crackles CARDIOVASCULAR: Normal heart rate and rhythm, S1 and S2 normal without murmurs ABDOMEN: Soft, non-tender, non-distended, without organomegaly, Normal bowel sounds EXTREMITIES: No cyanosis or edema NEUROLOGICAL: Cranial nerves grossly intact, Moves all extremities without gross motor or sensory deficit   Physical Exam  No results found.  No results found for this or any previous visit (from the past 2160 hours).      Beverley Adine Hummer, MD, MS

## 2023-10-20 LAB — URINALYSIS W MICROSCOPIC + REFLEX CULTURE
Bacteria, UA: NONE SEEN /HPF
Bilirubin Urine: NEGATIVE
Glucose, UA: NEGATIVE
Hgb urine dipstick: NEGATIVE
Hyaline Cast: NONE SEEN /LPF
Ketones, ur: NEGATIVE
Leukocyte Esterase: NEGATIVE
Nitrites, Initial: NEGATIVE
Protein, ur: NEGATIVE
RBC / HPF: NONE SEEN /HPF (ref 0–2)
Specific Gravity, Urine: 1.023 (ref 1.001–1.035)
Squamous Epithelial / HPF: NONE SEEN /HPF (ref <=5)
WBC, UA: NONE SEEN /HPF (ref 0–5)
pH: 6 (ref 5.0–8.0)

## 2023-10-20 LAB — NO CULTURE INDICATED

## 2023-10-22 ENCOUNTER — Ambulatory Visit: Payer: Self-pay | Admitting: Family Medicine

## 2023-10-22 DIAGNOSIS — R972 Elevated prostate specific antigen [PSA]: Secondary | ICD-10-CM

## 2023-10-22 DIAGNOSIS — D649 Anemia, unspecified: Secondary | ICD-10-CM

## 2023-10-30 ENCOUNTER — Other Ambulatory Visit (INDEPENDENT_AMBULATORY_CARE_PROVIDER_SITE_OTHER)

## 2023-10-30 DIAGNOSIS — E782 Mixed hyperlipidemia: Secondary | ICD-10-CM | POA: Diagnosis not present

## 2023-10-30 DIAGNOSIS — K219 Gastro-esophageal reflux disease without esophagitis: Secondary | ICD-10-CM | POA: Diagnosis not present

## 2023-10-30 LAB — HEMOGLOBIN A1C: Hgb A1c MFr Bld: 6.5 % (ref 4.6–6.5)

## 2023-10-30 LAB — COMPREHENSIVE METABOLIC PANEL WITH GFR
ALT: 19 U/L (ref 0–53)
AST: 22 U/L (ref 0–37)
Albumin: 4.1 g/dL (ref 3.5–5.2)
Alkaline Phosphatase: 61 U/L (ref 39–117)
BUN: 14 mg/dL (ref 6–23)
CO2: 26 meq/L (ref 19–32)
Calcium: 8.4 mg/dL (ref 8.4–10.5)
Chloride: 107 meq/L (ref 96–112)
Creatinine, Ser: 0.91 mg/dL (ref 0.40–1.50)
GFR: 92.14 mL/min (ref >60.00)
Glucose, Bld: 109 mg/dL — ABNORMAL HIGH (ref 70–99)
Potassium: 4 meq/L (ref 3.5–5.1)
Sodium: 144 meq/L (ref 135–145)
Total Bilirubin: 1.2 mg/dL (ref 0.2–1.2)
Total Protein: 6.5 g/dL (ref 6.0–8.3)

## 2023-10-30 LAB — LIPID PANEL
Cholesterol: 113 mg/dL (ref 0–200)
HDL: 34.8 mg/dL — ABNORMAL LOW (ref >39.00)
LDL Cholesterol: 44 mg/dL (ref 0–99)
NonHDL: 78.55
Total CHOL/HDL Ratio: 3
Triglycerides: 174 mg/dL — ABNORMAL HIGH (ref 0.0–149.0)
VLDL: 34.8 mg/dL (ref 0.0–40.0)

## 2023-10-30 LAB — CBC WITH DIFFERENTIAL/PLATELET
Basophils Absolute: 0.1 K/uL (ref 0.0–0.1)
Basophils Relative: 1 % (ref 0.0–3.0)
Eosinophils Absolute: 0.1 K/uL (ref 0.0–0.7)
Eosinophils Relative: 1.8 % (ref 0.0–5.0)
HCT: 34.8 % — ABNORMAL LOW (ref 39.0–52.0)
Hemoglobin: 11 g/dL — ABNORMAL LOW (ref 13.0–17.0)
Lymphocytes Relative: 31.7 % (ref 12.0–46.0)
Lymphs Abs: 2.1 K/uL (ref 0.7–4.0)
MCHC: 31.7 g/dL (ref 30.0–36.0)
MCV: 73.3 fl — ABNORMAL LOW (ref 78.0–100.0)
Monocytes Absolute: 0.6 K/uL (ref 0.1–1.0)
Monocytes Relative: 8.7 % (ref 3.0–12.0)
Neutro Abs: 3.8 K/uL (ref 1.4–7.7)
Neutrophils Relative %: 56.8 % (ref 43.0–77.0)
Platelets: 260 K/uL (ref 150.0–400.0)
RBC: 4.75 Mil/uL (ref 4.22–5.81)
RDW: 14.6 % (ref 11.5–15.5)
WBC: 6.6 K/uL (ref 4.0–10.5)

## 2023-10-30 LAB — TSH: TSH: 3.01 u[IU]/mL (ref 0.35–5.50)

## 2023-10-30 NOTE — Addendum Note (Signed)
 Addended by: SEBASTIAN BEVERLEY NOVAK on: 10/30/2023 03:34 PM   Modules accepted: Orders

## 2023-10-31 ENCOUNTER — Other Ambulatory Visit (INDEPENDENT_AMBULATORY_CARE_PROVIDER_SITE_OTHER): Payer: Self-pay

## 2023-10-31 ENCOUNTER — Ambulatory Visit (INDEPENDENT_AMBULATORY_CARE_PROVIDER_SITE_OTHER): Admitting: Orthopaedic Surgery

## 2023-10-31 ENCOUNTER — Encounter: Payer: Self-pay | Admitting: Orthopaedic Surgery

## 2023-10-31 DIAGNOSIS — M25512 Pain in left shoulder: Secondary | ICD-10-CM | POA: Diagnosis not present

## 2023-10-31 DIAGNOSIS — M25511 Pain in right shoulder: Secondary | ICD-10-CM | POA: Diagnosis not present

## 2023-10-31 DIAGNOSIS — G8929 Other chronic pain: Secondary | ICD-10-CM

## 2023-10-31 MED ORDER — METHYLPREDNISOLONE 4 MG PO TBPK: ORAL_TABLET | ORAL | 0 refills | Status: AC

## 2023-10-31 NOTE — Progress Notes (Signed)
 Office Visit Note   Patient: Peter Archer           Date of Birth: 07-31-1965           MRN: [id number] Visit Date: 10/31/2023              Requested by: Sebastian Beverley NOVAK, MD [address] [place],  [place] [address] PCP: Sebastian Beverley NOVAK, MD   Assessment & Plan: Visit Diagnoses:  1. Chronic pain of both shoulders     Plan: History of Present Illness Peter Archer is a 58 year old male who presents with bilateral shoulder pain, right worse than left, for two months.  The pain is located in the anterior shoulder and around the scapula, with soreness upon pressure. He experiences pain with certain movements, such as reaching across his body. Occasional numbness and tingling occur in the right hand, particularly in the morning.  He works as a tow Naval architect, which involves physical activity, but he manages to avoid exacerbating his symptoms. He takes Celebrex , with variable relief, and is unsure of when he started the medication.  Physical Exam MUSCULOSKELETAL: Right shoulder forward flexion and external rotation are normal with pain. Right shoulder internal rotation is normal. Tenderness in the right biceps. Pain with Hawkins maneuver and when reaching across with the right shoulder. Right shoulder rotator cuff strength is good with pain. Left shoulder forward flexion is normal with minimal pain. Left shoulder external and internal rotation are normal. Tenderness in the left biceps. Pain when reaching across with the left shoulder. No pain with Hawkins impingement on the left shoulder. Left shoulder rotator cuff strength is good.  Results RADIOLOGY Shoulder X-ray: No acute abnormalities, mild degenerative changes  Assessment and Plan Right shoulder rotator cuff tendinitis and bursitis Overuse, inflammation, and wear and tear tendinitis in the rotator cuff with possible bursitis. No acute structural abnormalities. - Prescribe oral steroids for six days to reduce  inflammation. - Instruct to stop Celebrex  and resume post-steroid course if needed for one to two weeks. - Advise reduced physical activity to prevent aggravation. - Send prescription to [facility] on [address].  Left shoulder rotator cuff tendinitis and bursitis Overuse and mild inflammation with wear and tear tendinitis in the rotator cuff. No acute structural abnormalities. - Monitor symptoms and manage with activity modification.  Follow-Up Instructions: No follow-ups on file.   Orders:  Orders Placed This Encounter  Procedures   XR Shoulder Left   XR Shoulder Right   Meds ordered this encounter  Medications   methylPREDNISolone  (MEDROL  DOSEPAK) 4 MG TBPK tablet    Sig: Take as directed    Dispense:  21 tablet    Refill:  0      Procedures: No procedures performed   Clinical Data: No additional findings.   Subjective: Chief Complaint  Patient presents with   Right Shoulder - Pain   Left Shoulder - Pain    HPI  Review of Systems  Constitutional: Negative.   HENT: Negative.    Eyes: Negative.   Respiratory: Negative.    Cardiovascular: Negative.   Gastrointestinal: Negative.   Endocrine: Negative.   Genitourinary: Negative.   Skin: Negative.   Allergic/Immunologic: Negative.   Neurological: Negative.   Hematological: Negative.   Psychiatric/Behavioral: Negative.    All other systems reviewed and are negative.    Objective: Vital Signs: There were no vitals taken for this visit.  Physical Exam Vitals and nursing note reviewed.  Constitutional:  Appearance: He is well-developed.  HENT:     Head: Normocephalic and atraumatic.  Eyes:     Pupils: Pupils are equal, round, and reactive to light.  Pulmonary:     Effort: Pulmonary effort is normal.  Abdominal:     Palpations: Abdomen is soft.  Musculoskeletal:        General: Normal range of motion.     Cervical back: Neck supple.  Skin:    General: Skin is warm.  Neurological:      Mental Status: He is alert and oriented to person, place, and time.  Psychiatric:        Behavior: Behavior normal.        Thought Content: Thought content normal.        Judgment: Judgment normal.     Ortho Exam  Specialty Comments:  No specialty comments available.  Imaging: XR Shoulder Right Result Date: 10/31/2023 X-rays of the right shoulder showed no acute abnormalities.  There is age-appropriate minor degenerative changes of the [facility] joint.  XR Shoulder Left Result Date: 10/31/2023 X-rays of the left shoulder show no acute abnormalities.  There are some mild age-appropriate minor degenerative changes of the [facility] joint.    PMFS History: Patient Active Problem List   Diagnosis Date Noted   Prostatitis 04/09/2023   Elevated PSA 04/09/2023   Epistaxis not due to trauma 03/19/2023   Frequent urination 03/19/2023   Moderate mixed hyperlipidemia not requiring statin therapy 03/19/2023   Class 1 obesity due to excess calories without serious comorbidity with body mass index (BMI) of 33.0 to 33.9 in adult 03/19/2023   Encounter for well adult exam without abnormal findings 03/19/2023   Postprandial RUQ pain 12/25/2022   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   GERD (gastroesophageal reflux disease) 04/02/2018   Seasonal allergies 04/02/2018   Cough present for greater than 3 weeks 04/02/2018   Closed nondisplaced fracture of distal phalanx of left middle finger 08/28/2017   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) ago but never had it rechecked - [facility]- told benign    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   Bilateral inguinal hernia    Environmental and seasonal allergies    GERD (gastroesophageal reflux disease)    on meds   History of 2019 novel coronavirus disease (COVID-19)    per pt positive test 10/ 2020 with mild symptoms that resolved in a week   Umbilical hernia     Family History   Problem Relation Age of Onset   Heart disease Father    Stroke Paternal Grandmother    Cancer Paternal Grandmother        colon   Colon cancer Neg Hx    Colon polyps Neg Hx    Esophageal cancer Neg Hx    Stomach cancer Neg Hx    Rectal cancer Neg Hx     Past Surgical History:  Procedure Laterality Date   APPENDECTOMY  1985 approx.   COLONOSCOPY  04/2019   INGUINAL HERNIA REPAIR Bilateral 11/27/2019   Procedure: LAPAROSCOPIC INGUINAL HERNIA WITH MESH;  Surgeon: Kinsinger, Herlene Righter, MD;  Location: [facility] [place];  Service: General;  Laterality: Bilateral;   UMBILICAL HERNIA REPAIR N/A 11/27/2019   Procedure: OPEN HERNIA REPAIR UMBILICAL WITH MESH;  Surgeon: Kinsinger, Herlene Righter, MD;  Location: [facility] [place];  Service: General;  Laterality: N/A;   Social History  Occupational History   Not on file  Tobacco Use   Smoking status: Never    Passive exposure: Never   Smokeless tobacco: Never  Vaping Use   Vaping status: Never Used  Substance and Sexual Activity   Alcohol use: Yes    Alcohol/week: 2.0 standard drinks of alcohol    Types: 2 Standard drinks or equivalent per week    Comment: occasional   Drug use: Never   Sexual activity: Yes

## 2023-11-12 ENCOUNTER — Other Ambulatory Visit

## 2023-11-13 ENCOUNTER — Other Ambulatory Visit

## 2023-11-14 ENCOUNTER — Other Ambulatory Visit (INDEPENDENT_AMBULATORY_CARE_PROVIDER_SITE_OTHER)

## 2023-11-14 DIAGNOSIS — D649 Anemia, unspecified: Secondary | ICD-10-CM

## 2023-11-14 LAB — CBC WITH DIFFERENTIAL/PLATELET
Basophils Absolute: 0.1 K/uL (ref 0.0–0.1)
Basophils Relative: 1 % (ref 0.0–3.0)
Eosinophils Absolute: 0.1 K/uL (ref 0.0–0.7)
Eosinophils Relative: 1.8 % (ref 0.0–5.0)
HCT: 34.7 % — ABNORMAL LOW (ref 39.0–52.0)
Hemoglobin: 11.1 g/dL — ABNORMAL LOW (ref 13.0–17.0)
Lymphocytes Relative: 22.2 % (ref 12.0–46.0)
Lymphs Abs: 1.5 K/uL (ref 0.7–4.0)
MCHC: 32.1 g/dL (ref 30.0–36.0)
MCV: 71.6 fl — ABNORMAL LOW (ref 78.0–100.0)
Monocytes Absolute: 0.6 K/uL (ref 0.1–1.0)
Monocytes Relative: 9.5 % (ref 3.0–12.0)
Neutro Abs: 4.3 K/uL (ref 1.4–7.7)
Neutrophils Relative %: 65.5 % (ref 43.0–77.0)
Platelets: 284 K/uL (ref 150.0–400.0)
RBC: 4.85 Mil/uL (ref 4.22–5.81)
RDW: 15.3 % (ref 11.5–15.5)
WBC: 6.6 K/uL (ref 4.0–10.5)

## 2023-11-14 LAB — FERRITIN: Ferritin: 9.8 ng/mL — ABNORMAL LOW (ref 22.0–322.0)

## 2023-11-14 LAB — FOLATE: Folate: 15 ng/mL (ref >5.9)

## 2023-11-14 LAB — VITAMIN B12: Vitamin B-12: 269 pg/mL (ref 211–911)

## 2023-11-15 ENCOUNTER — Ambulatory Visit: Payer: Self-pay | Admitting: Family Medicine

## 2023-11-15 LAB — IRON, TOTAL/TOTAL IRON BINDING CAP
%SAT: 10 % — ABNORMAL LOW (ref 20–48)
Iron: 40 ug/dL — ABNORMAL LOW (ref 50–180)
TIBC: 421 ug/dL (ref 250–425)

## 2023-12-25 NOTE — Progress Notes (Deleted)
 Assessment: No diagnosis found.   Plan:  Chief Complaint:  No chief complaint on file.   History of Present Illness:  Peter Archer is a 58 y.o. male who is seen for further evaluation of elevated PSA. PSA results: 12/20 2.47 1/25 6.23  No prior history of elevated PSA.  No known history of prostatitis or UTIs.  No family history of prostate cancer. He reports nocturia x 2 but no other lower urinary tract symptoms.  No dysuria or gross hematuria. He was started on tamsulosin  approximately 1 week prior to his visit on 04/09/23. IPSS = 7. His exam was consistent with prostatitis.  He was treated with Bactrim  DS x 21 days.  He returns today for follow-up.  He has completed the Bactrim  as prescribed.  He has noted improvement in his urinary symptoms.  No dysuria or gross hematuria.  He discontinued the tamsulosin  after his last visit. IPSS = 2/2. He report occasional difficulty maintaining his erection.  This occurs about once a month.  He has previously tried tadalafil without improvement.  Portions of the above documentation were copied from a prior visit for review purposes only.   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   COLONOSCOPY  04/2019   INGUINAL HERNIA REPAIR Bilateral 11/27/2019   Procedure: LAPAROSCOPIC INGUINAL HERNIA WITH MESH;  Surgeon: Kinsinger, Herlene Righter, MD;  Location:  [facility] [place];  Service: General;  Laterality: Bilateral;   UMBILICAL HERNIA REPAIR N/A 11/27/2019   Procedure: OPEN HERNIA REPAIR UMBILICAL WITH MESH;  Surgeon: Kinsinger, Herlene Righter, MD;  Location: [facility] [place];  Service: General;  Laterality: N/A;    Allergies:  Allergies  Allergen Reactions   Codeine Anaphylaxis    Family History:  Family History  Problem Relation Age of Onset   Heart disease Father    Stroke Paternal Grandmother    Cancer Paternal Grandmother        colon   Colon cancer Neg Hx    Colon polyps Neg Hx    Esophageal cancer Neg Hx    Stomach cancer Neg Hx    Rectal cancer Neg Hx     Social History:  Social History   Tobacco Use   Smoking status: Never    Passive exposure: Never   Smokeless tobacco: Never  Vaping Use   Vaping status: Never Used  Substance Use Topics   Alcohol use: Yes    Alcohol/week: 2.0 standard drinks of alcohol    Types: 2 Standard drinks or equivalent per week    Comment: occasional   Drug use: Never    ROS: Constitutional:  Negative for fever, chills, weight loss CV: Negative for chest pain, previous MI, hypertension Respiratory:  Negative for shortness of breath, wheezing, sleep apnea, frequent cough GI:  Negative for nausea, vomiting,  bloody stool, GERD  Physical exam: There were no vitals taken for this visit. GENERAL APPEARANCE:  Well appearing, well developed, well nourished, NAD HEENT:  Atraumatic, normocephalic, oropharynx clear NECK:  Supple without lymphadenopathy or thyromegaly ABDOMEN:  Soft, non-tender, no masses EXTREMITIES:  Moves all extremities well, without clubbing, cyanosis, or edema NEUROLOGIC:  Alert and oriented x 3, normal gait, CN II-XII grossly intact MENTAL STATUS:  appropriate BACK:  Non-tender to palpation, No CVAT SKIN:  Warm, dry, and intact   Results: U/A: negative

## 2023-12-26 ENCOUNTER — Ambulatory Visit (INDEPENDENT_AMBULATORY_CARE_PROVIDER_SITE_OTHER): Admitting: Urology

## 2023-12-26 ENCOUNTER — Encounter: Payer: Self-pay | Admitting: Urology

## 2023-12-26 ENCOUNTER — Telehealth: Payer: Self-pay

## 2023-12-26 ENCOUNTER — Ambulatory Visit: Admitting: Urology

## 2023-12-26 VITALS — BP 138/90 | HR 61 | Ht 67.0 in | Wt 190.0 lb

## 2023-12-26 DIAGNOSIS — N419 Inflammatory disease of prostate, unspecified: Secondary | ICD-10-CM | POA: Diagnosis not present

## 2023-12-26 DIAGNOSIS — R972 Elevated prostate specific antigen [PSA]: Secondary | ICD-10-CM

## 2023-12-26 LAB — URINALYSIS, ROUTINE W REFLEX MICROSCOPIC
Bilirubin, UA: NEGATIVE
Glucose, UA: NEGATIVE
Ketones, UA: NEGATIVE
Leukocytes,UA: NEGATIVE
Nitrite, UA: NEGATIVE
Protein,UA: NEGATIVE
RBC, UA: NEGATIVE
Specific Gravity, UA: 1.02 (ref 1.005–1.030)
Urobilinogen, Ur: 0.2 mg/dL (ref 0.2–1.0)
pH, UA: 7 (ref 5.0–7.5)

## 2023-12-26 NOTE — Telephone Encounter (Signed)
 Copied from CRM [id number]. Topic: Clinical - Medical Advice >> Dec 26, 2023 11:04 AM Mercedes MATSU wrote: Reason for CRM: Patient called in stating that his iron is low and he wants to know if he will be prescribed something or is it even concerning? Patient states no one has reached out to him in regards to this and requesting a call back at [phone]  Is patient okay to start a iron supplement ?

## 2023-12-26 NOTE — Progress Notes (Signed)
 Assessment: 1. Elevated PSA   2. Prostatitis, unspecified prostatitis type     Plan: I personally reviewed the patient's chart including provider notes, and lab results. I had a lengthy discussion with the patient regarding elevated PSA and options for evaluation.  Specifically, I discussed evaluation with a prostate biopsy.  I also discussed the role of MRI in the evaluation of elevated PSA.  I also discussed additional testing with urine biomarkers.  I recommended that he strongly consider further evaluation given his persistently elevated PSA. He would like to consider these options and will let me know how he wishes to proceed in the next week.  Chief Complaint:  Chief Complaint  Patient presents with   Elevated PSA    History of Present Illness:  Peter Archer is a 58 y.o. male who is seen for further evaluation of elevated PSA. PSA results: 12/20 2.47 1/25 6.23 3/25 5.6 8/25 5.89  No prior history of elevated PSA.  No known history of prostatitis or UTIs.  No family history of prostate cancer. He reports nocturia x 2 but no other lower urinary tract symptoms.  No dysuria or gross hematuria. He was started on tamsulosin  approximately 1 week prior to his visit on 04/09/23. IPSS = 7. His exam was consistent with prostatitis.  He was treated with Bactrim  DS x 21 days.  At his visit in March 2025, he had completed the Bactrim  as prescribed.  He noted improvement in his urinary symptoms.  No dysuria or gross hematuria.  He discontinued the tamsulosin  after his last visit. IPSS = 2/2. He reported occasional difficulty maintaining his erection, occurring about once a month.  He had previously tried tadalafil without improvement.  He returns today for follow-up.  He is not having any significant lower urinary tract symptoms.  He has nocturia x 1.  No dysuria or gross hematuria.  He is not currently on any medical therapy. IPSS = 2/1.  Portions of the above documentation were  copied from a prior visit for review purposes only.   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   COLONOSCOPY  04/2019   INGUINAL HERNIA REPAIR Bilateral 11/27/2019   Procedure: LAPAROSCOPIC INGUINAL HERNIA WITH MESH;  Surgeon: Kinsinger, Herlene Righter, MD;  Location: [facility] [place];  Service: General;  Laterality: Bilateral;   UMBILICAL HERNIA REPAIR N/A 11/27/2019   Procedure: OPEN HERNIA REPAIR UMBILICAL WITH MESH;  Surgeon: Kinsinger, Herlene Righter, MD;  Location: [facility];  Service: General;  Laterality: N/A;    Allergies:  Allergies  Allergen Reactions   Codeine Anaphylaxis    Family History:  Family History  Problem Relation Age of Onset   Heart disease Father    Stroke Paternal Grandmother    Cancer Paternal Grandmother  colon   Colon cancer Neg Hx    Colon polyps Neg Hx    Esophageal cancer Neg Hx    Stomach cancer Neg Hx    Rectal cancer Neg Hx     Social History:  Social History   Tobacco Use   Smoking status: Never    Passive exposure: Never   Smokeless tobacco: Never  Vaping Use   Vaping status: Never Used  Substance Use Topics   Alcohol use: Yes    Alcohol/week: 2.0 standard drinks of alcohol    Types: 2 Standard drinks or equivalent per week    Comment: occasional   Drug use: Never    ROS: Constitutional:  Negative for fever, chills,  weight loss CV: Negative for chest pain, previous MI, hypertension Respiratory:  Negative for shortness of breath, wheezing, sleep apnea, frequent cough GI:  Negative for nausea, vomiting, bloody stool, GERD  Physical exam: BP (!) 138/90   Pulse 61   Ht 5' 7 (1.702 m)   Wt 190 lb (86.2 kg)   BMI 29.76 kg/m  GENERAL APPEARANCE:  Well appearing, well developed, well nourished, NAD HEENT:  Atraumatic, normocephalic, oropharynx clear NECK:  Supple without lymphadenopathy or thyromegaly ABDOMEN:  Soft, non-tender, no masses EXTREMITIES:  Moves all extremities well, without clubbing, cyanosis, or edema NEUROLOGIC:  Alert and oriented x 3, normal gait, CN II-XII grossly intact MENTAL STATUS:  appropriate BACK:  Non-tender to palpation, No CVAT SKIN:  Warm, dry, and intact   Results: U/A: Negative

## 2023-12-27 ENCOUNTER — Other Ambulatory Visit: Payer: Self-pay | Admitting: Urology

## 2023-12-27 DIAGNOSIS — R972 Elevated prostate specific antigen [PSA]: Secondary | ICD-10-CM

## 2024-01-02 ENCOUNTER — Other Ambulatory Visit (HOSPITAL_BASED_OUTPATIENT_CLINIC_OR_DEPARTMENT_OTHER): Payer: Self-pay | Admitting: Urology

## 2024-01-02 ENCOUNTER — Ambulatory Visit (HOSPITAL_BASED_OUTPATIENT_CLINIC_OR_DEPARTMENT_OTHER)
Admission: RE | Admit: 2024-01-02 | Discharge: 2024-01-02 | Disposition: A | Discharge status: Home / Self Care | Source: Ambulatory Visit | Attending: Urology | Admitting: Urology

## 2024-01-02 ENCOUNTER — Ambulatory Visit (INDEPENDENT_AMBULATORY_CARE_PROVIDER_SITE_OTHER): Admitting: Urology

## 2024-01-02 ENCOUNTER — Encounter: Payer: Self-pay | Admitting: Urology

## 2024-01-02 VITALS — BP 150/91 | HR 58 | Ht 68.0 in | Wt 190.0 lb

## 2024-01-02 DIAGNOSIS — R972 Elevated prostate specific antigen [PSA]: Secondary | ICD-10-CM

## 2024-01-02 DIAGNOSIS — N4231 Prostatic intraepithelial neoplasia: Secondary | ICD-10-CM

## 2024-01-02 DIAGNOSIS — Z2989 Encounter for other specified prophylactic measures: Secondary | ICD-10-CM | POA: Diagnosis not present

## 2024-01-02 LAB — URINALYSIS, ROUTINE W REFLEX MICROSCOPIC
Bilirubin, UA: NEGATIVE
Glucose, UA: NEGATIVE
Ketones, UA: NEGATIVE
Leukocytes,UA: NEGATIVE
Nitrite, UA: NEGATIVE
Protein,UA: NEGATIVE
RBC, UA: NEGATIVE
Specific Gravity, UA: 1.015 (ref 1.005–1.030)
Urobilinogen, Ur: 0.2 mg/dL (ref 0.2–1.0)
pH, UA: 5.5 (ref 5.0–7.5)

## 2024-01-02 MED ORDER — CEFTRIAXONE SODIUM 1 G IJ SOLR
1.0000 g | Freq: Once | INTRAMUSCULAR | Status: AC
  Administered 2024-01-02: 1 g via INTRAMUSCULAR

## 2024-01-02 NOTE — Addendum Note (Signed)
 Addended by: ROSEANN ADINE PARAS on: 01/02/2024 09:00 AM   Modules accepted: Orders

## 2024-01-02 NOTE — Progress Notes (Signed)
 Assessment: 1. Elevated PSA     Plan: Postbiopsy instructions given. Return to office in 7-10 days for biopsy results.  Chief Complaint:  Chief Complaint  Patient presents with   Prostate Biopsy    History of Present Illness:  Peter Archer is a 58 y.o. male who is seen for further evaluation of elevated PSA. PSA results: 12/20 2.47 1/25 6.23 3/25 5.6 8/25 5.89  No prior history of elevated PSA.  No known history of prostatitis or UTIs.  No family history of prostate cancer. He reports nocturia x 2 but no other lower urinary tract symptoms.  No dysuria or gross hematuria. He was started on tamsulosin  approximately 1 week prior to his visit on 04/09/23. IPSS = 7. His exam was consistent with prostatitis.  He was treated with Bactrim  DS x 21 days.  At his visit in March 2025, he had completed the Bactrim  as prescribed.  He noted improvement in his urinary symptoms.  No dysuria or gross hematuria.  He discontinued the tamsulosin  after his last visit. IPSS = 2/2. He reported occasional difficulty maintaining his erection, occurring about once a month.  He had previously tried tadalafil without improvement.  At his visit on 12/26/23, he was not having any significant lower urinary tract symptoms other than nocturia x 1.  No dysuria or gross hematuria.  He was not on any medical therapy. IPSS = 2/1.  Portions of the above documentation were copied from a prior visit for review purposes only.   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   COLONOSCOPY  04/2019   INGUINAL HERNIA REPAIR Bilateral 11/27/2019   Procedure: LAPAROSCOPIC INGUINAL HERNIA WITH MESH;  Surgeon: Kinsinger, Herlene Righter, MD;  Location: [facility] [place];  Service: General;  Laterality: Bilateral;   UMBILICAL HERNIA REPAIR N/A 11/27/2019   Procedure: OPEN HERNIA REPAIR UMBILICAL WITH MESH;  Surgeon: Kinsinger, Herlene Righter, MD;  Location: [facility] [place];  Service: General;  Laterality: N/A;    Allergies:  Allergies  Allergen Reactions   Codeine Anaphylaxis    Family History:  Family History  Problem Relation Age of Onset   Heart disease Father    Stroke Paternal Grandmother    Cancer Paternal Grandmother        colon   Colon cancer Neg Hx    Colon polyps Neg Hx    Esophageal cancer Neg Hx    Stomach cancer Neg Hx    Rectal cancer Neg Hx     Social History:  Social History   Tobacco Use   Smoking status: Never    Passive exposure: Never   Smokeless tobacco: Never  Vaping Use   Vaping status: Never Used  Substance Use Topics   Alcohol use: Yes    Alcohol/week: 2.0 standard drinks of alcohol  Types: 2 Standard drinks or equivalent per week    Comment: occasional   Drug use: Never    ROS: Constitutional:  Negative for fever, chills, weight loss CV: Negative for chest pain, previous MI, hypertension Respiratory:  Negative for shortness of breath, wheezing, sleep apnea, frequent cough GI:  Negative for nausea, vomiting, bloody stool, GERD  Physical exam: BP (!) 150/91   Pulse (!) 58   Ht 5' 8 (1.727 m)   Wt 190 lb (86.2 kg)   BMI 28.89 kg/m  GENERAL APPEARANCE:  Well appearing, well developed, well nourished, NAD  Results: U/A: negative  TRANSRECTAL ULTRASOUND AND PROSTATE BIOPSY  Indication:  Elevated PSA  Prophylactic antibiotic administration:  Rocephin  All medications that could result in increased bleeding were discontinued within an appropriate period of the time of biopsy.  Risk including bleeding and infection were discussed.  Informed consent was obtained.  The patient was placed in the left lateral decubitus position.  PROCEDURE 1.  TRANSRECTAL ULTRASOUND OF THE PROSTATE  The 7 MHz transrectal probe was used to image the prostate.  Anal stenosis was not noted.  TRUS volume: 24 ml  Hypoechoic areas: None  Hyperechoic areas: None  Central calcifications: not present  Margins:  normal  Seminal Vesicles: normal   PROCEDURE 2:  PROSTATE BIOPSY  A periprostatic block was performed using 1% lidocaine  and transrectal ultrasound guidance. Under transrectal ultrasound guidance, and using the Biopty gun, prostate biopsies were obtained systematically from the apex, mid gland, and base bilaterally.  A total of 12 cores were obtained.  Hemostasis was obtained with gentle pressure on the prostate.  The procedures were well-tolerated.  No significant bleeding was noted at the end of the procedure.  The patient was stable for discharge from the office.

## 2024-01-02 NOTE — Progress Notes (Signed)
 IM Injection  Patient is present today for an IM Injection for treatment of infection prevention post prostate biopsy Drug: Ceftriaxone Dose:1g Location:right upper outer buttocks Lot: [id number] Exp:08/2025  Patient tolerated well, no complications were noted  Performed by: Belinda Schlichting CMA

## 2024-01-07 ENCOUNTER — Encounter: Payer: Self-pay | Admitting: Radiology

## 2024-01-07 LAB — PROSTATE CORE NEEDLE BIOPSY

## 2024-01-08 ENCOUNTER — Encounter: Payer: Self-pay | Admitting: Urology

## 2024-01-08 ENCOUNTER — Ambulatory Visit: Payer: Self-pay | Admitting: Urology

## 2024-01-09 ENCOUNTER — Ambulatory Visit: Admitting: Urology

## 2024-02-04 ENCOUNTER — Encounter: Payer: Self-pay | Admitting: Radiology

## 2024-02-04 ENCOUNTER — Encounter: Payer: Self-pay | Admitting: Physician Assistant

## 2024-02-04 ENCOUNTER — Other Ambulatory Visit (INDEPENDENT_AMBULATORY_CARE_PROVIDER_SITE_OTHER): Payer: Self-pay

## 2024-02-04 ENCOUNTER — Ambulatory Visit (INDEPENDENT_AMBULATORY_CARE_PROVIDER_SITE_OTHER): Admitting: Physician Assistant

## 2024-02-04 ENCOUNTER — Telehealth: Payer: Self-pay | Admitting: Radiology

## 2024-02-04 DIAGNOSIS — M542 Cervicalgia: Secondary | ICD-10-CM

## 2024-02-04 DIAGNOSIS — M25512 Pain in left shoulder: Secondary | ICD-10-CM

## 2024-02-04 DIAGNOSIS — M25511 Pain in right shoulder: Secondary | ICD-10-CM

## 2024-02-04 DIAGNOSIS — G8929 Other chronic pain: Secondary | ICD-10-CM | POA: Diagnosis not present

## 2024-02-04 MED ORDER — CYCLOBENZAPRINE HCL 10 MG PO TABS: 10.0000 mg | ORAL_TABLET | Freq: Every day | ORAL | 0 refills | Status: AC

## 2024-02-04 NOTE — Addendum Note (Signed)
 Addended by: PETER FRIEZE B on: 02/04/2024 04:13 PM   Modules accepted: Orders

## 2024-02-04 NOTE — Telephone Encounter (Signed)
 Called patient

## 2024-02-04 NOTE — Telephone Encounter (Signed)
 Patient forgot to ask during visit today, would any type of brace help him to relieve pressure? States that when his shoulders are pulled back some of the pressure or pain he has eases off.  He was asking about a clavicle strap or posture brace?  Please advise.

## 2024-02-04 NOTE — Progress Notes (Signed)
 Office Visit Note   Patient: Peter Archer           Date of Birth: 1965/10/18           MRN: [id number] Visit Date: 02/04/2024              Requested by: Sebastian Beverley NOVAK, MD [address] [place],  [place] [address] PCP: Sebastian Beverley NOVAK, MD   Assessment & Plan: Visit Diagnoses:  1. Chronic pain of both shoulders     Plan: Will have him continue his Celebrex .  Add Flexeril  at nighttime.  Physical therapy ordered for range of motion modalities and home exercise program for his C-spine.  Follow-up with us  in 6 weeks see how he is doing overall.  He has had no improvement in the next 2 weeks and call and we will try to gain approval for an MRI of his cervical spine for possible epidural steroid planning.  Questions were encouraged and answered at length.  Follow-Up Instructions: Return in about 6 weeks (around 03/17/2024).   Orders:  Orders Placed This Encounter  Procedures   XR Cervical Spine 2 or 3 views   Meds ordered this encounter  Medications   cyclobenzaprine  (FLEXERIL ) 10 MG tablet    Sig: Take 1 tablet (10 mg total) by mouth at bedtime.    Dispense:  30 tablet    Refill:  0      Procedures: No procedures performed   Clinical Data: No additional findings.   Subjective: Chief Complaint  Patient presents with   Right Shoulder - Pain   Left Shoulder - Pain    HPI [name] is 58 year old male were seen for bilateral shoulder pain.  He relates that he was seeing back 10/31/2023 in our office and was given prednisone  for shoulder pain.  He states while on the medication that he had decreased pain.  He is now having numbness tingling down both arms.  Right pain is worse than left pain is worse at night.  Does have painful motion of his neck.  He notes numbness and tingling down both arms and occasionally down into the right pinky.  Has had no known injury.  Has pain about most scapula.  He states he cannot lie on his right or left side due to the  pain.  Review of Systems See HPI otherwise negative  Objective: Vital Signs: There were no vitals taken for this visit.  Physical Exam Constitutional:      Appearance: He is not ill-appearing or diaphoretic.  Cardiovascular:     Pulses: Normal pulses.  Pulmonary:     Effort: Pulmonary effort is normal.  Neurological:     Mental Status: He is alert and oriented to person, place, and time.  Psychiatric:        Mood and Affect: Mood normal.     Ortho Exam Cervical spine: He has full flexion extension without pain.  Rotation of the right left causes discomfort.  Positive Spurling's. Upper extremities: Bilateral shoulders full range of motion without significant pain.  He has tenderness medial border scapula bilaterally.  5 out of 5 strength with external/internal rotation against resistance.  Liftoff test is negative bilaterally.  Has weakness bilaterally with empty can test.  Bicep strength 5 out of 5 tricep strength 5 out of 5 against resistance.  Okay sign 5 out of 5 strength full motor.  Subjective decrease sensation over the right ring finger to light touch otherwise sensation subjectively intact. Specialty Comments:  No specialty comments available.  Imaging: XR Cervical Spine 2 or 3 views Result Date: 02/04/2024 Cervical spine 2 views: No acute fracture.  Slight retrospondylolisthesis C3 on C4.  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  Multilevel anterior vertebral osteophytes present.  No other acute findings.  Slight loss of lordosis.    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   GERD (gastroesophageal reflux disease) 04/02/2018   Seasonal allergies 04/02/2018   Cough present for greater than 3 weeks 04/02/2018   Closed nondisplaced fracture of distal phalanx of left middle finger 08/28/2017   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) ago but never had it rechecked - [facility]- told benign    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   Bilateral inguinal hernia    Environmental and seasonal allergies    GERD (gastroesophageal reflux disease)    on meds   History of 2019 novel coronavirus disease (COVID-19)    per pt positive test 10/ 2020 with mild symptoms that resolved in a week   Umbilical hernia     Family History  Problem Relation Age of Onset   Heart disease Father    Stroke Paternal Grandmother    Cancer Paternal Grandmother        colon   Colon cancer Neg Hx    Colon polyps Neg Hx    Esophageal cancer Neg Hx    Stomach cancer Neg Hx    Rectal cancer Neg Hx     Past Surgical History:  Procedure Laterality Date   APPENDECTOMY  1985 approx.   COLONOSCOPY  04/2019   INGUINAL HERNIA REPAIR Bilateral 11/27/2019   Procedure: LAPAROSCOPIC INGUINAL HERNIA WITH MESH;  Surgeon: Kinsinger, Herlene Righter, MD;  Location: [facility] [place];  Service: General;  Laterality: Bilateral;   UMBILICAL HERNIA REPAIR N/A 11/27/2019   Procedure: OPEN HERNIA REPAIR UMBILICAL WITH MESH;  Surgeon: Kinsinger, Herlene Righter, MD;  Location: [facility] [place];  Service: General;  Laterality: N/A;   Social History   Occupational History   Not on file  Tobacco Use   Smoking status: Never    Passive exposure: Never   Smokeless tobacco: Never  Vaping Use   Vaping status: Never Used  Substance and Sexual Activity   Alcohol use: Yes    Alcohol/week: 2.0 standard drinks of alcohol     Types: 2 Standard drinks or equivalent per week    Comment: occasional   Drug use: Never   Sexual activity: Yes

## 2024-02-13 NOTE — Therapy (Signed)
 OUTPATIENT PHYSICAL THERAPY UPPER EXTREMITY EVALUATION   Patient Name: Peter Archer MRN: [id number] DOB:11-25-1965, 58 y.o., male Today's Date: 02/15/2024  END OF SESSION:  PT End of Session - 02/15/24 0915     Visit Number 1    Number of Visits 16    Date for Recertification  04/11/24    Authorization Type BCBS AUTH NEEDED $100 COPAY    PT Start Time 0931    PT Stop Time 1019    PT Time Calculation (min) 48 min    Activity Tolerance Patient tolerated treatment well;Patient limited by pain    Behavior During Therapy [facility] for tasks assessed/performed          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   COLONOSCOPY  04/2019   INGUINAL HERNIA REPAIR Bilateral 11/27/2019   Procedure: LAPAROSCOPIC INGUINAL HERNIA WITH MESH;  Surgeon: Kinsinger, Herlene Righter, MD;  Location: [facility] [place];  Service: General;  Laterality: Bilateral;   UMBILICAL HERNIA REPAIR N/A 11/27/2019   Procedure: OPEN HERNIA REPAIR UMBILICAL WITH MESH;  Surgeon: Kinsinger, Herlene Righter, MD;  Location: [facility] [place];  Service: General;  Laterality: N/A;   Patient Active Problem List   Diagnosis Date Noted   Prostatitis 04/09/2023   Elevated PSA 04/09/2023   Epistaxis not due to trauma 03/19/2023   Frequent urination 03/19/2023   Moderate mixed hyperlipidemia not requiring statin therapy 03/19/2023   Class 1 obesity due to excess calories without  serious comorbidity with body mass index (BMI) of 33.0 to 33.9 in adult 03/19/2023   Encounter for well adult exam without abnormal findings 03/19/2023   Postprandial RUQ pain 12/25/2022   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   GERD (gastroesophageal reflux disease) 04/02/2018   Seasonal allergies 04/02/2018   Cough present for greater than 3 weeks 04/02/2018   Closed nondisplaced fracture of distal phalanx of left middle finger 08/28/2017    PCP: Righter Hummer, MD  REFERRING PROVIDER: Bertrum Gaskins, PA-C  REFERRING DIAG: M25.511,G89.29,M25.512 (ICD-10-CM) - Chronic pain of both shoulders M54.2 (ICD-10-CM) - Cervicalgia  THERAPY DIAG:  Cervicalgia  Chronic pain of both shoulders  Stiffness of right shoulder, not elsewhere classified  Stiffness of left shoulder, not elsewhere classified  Muscle weakness (generalized)  Rationale for Evaluation and Treatment: Rehabilitation  ONSET DATE: years   SUBJECTIVE:  SUBJECTIVE STATEMENT: To me it has gotten better because I changed pillows at home. Hand dominance: Right  PERTINENT HISTORY: Patient reports that he did not have an event that led to his pain/soreness in neck and bilat shoulders. He had one fall many years ago where he landed on the floor, but was cleared by medical personnel. Patient has not had any surgeries, trauma, or other injuries to the area and surrounding joints. Patient received one cortisone shot in one shoulder (unsure of which one) many years ago that assisted with his pain, but hasn't had one since.   See PMH or personal factors for in depth comorbidities   PAIN:  Are you having pain? Yes: NPRS scale: 3/10 at rest, 10/10 at worst  Pain location: upper shoulder and neck (starts at bottom of shoulder blades and goes up) Pain description:  sharp, shooting, aching, dull  Aggravating factors: sleeping, lying on shoulder, pulling cables for work  Relieving factors: tylenol , celebrex    PRECAUTIONS: None  RED FLAGS: Cervical red flags: Dysphagia No, Dysmetria No, Diplopia No, Nystagmus No, and Nausea No and Bowel or bladder incontinence: No   WEIGHT BEARING RESTRICTIONS: No  FALLS:  Has patient fallen in last 6 months? No  LIVING ENVIRONMENT: Lives with: lives with their spouse Lives in: House/apartment Stairs: Yes: Internal: 15 steps; on left going up Has following equipment at home: None  OCCUPATION: Truck driver   PLOF: Independent  PATIENT GOALS: make me better   NEXT MD VISIT: 03/17/2024 with Bertrum Gaskins, PA-C   OBJECTIVE:  Note: Objective measures were completed at Evaluation unless otherwise noted.  DIAGNOSTIC FINDINGS:  Cervical spine 2 views: No acute fracture.  Slight retrospondylolisthesis  C3 on C4.  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  Multilevel  anterior vertebral osteophytes present.  No other acute findings.  Slight  loss of lordosis.   X-rays of the right shoulder showed no acute abnormalities.  There is  age-appropriate minor degenerative changes of the [facility] joint.   X-rays of the left shoulder show no acute abnormalities.  There are some  mild age-appropriate minor degenerative changes of the [facility] joint.   PATIENT SURVEYS :  PSFS: THE PATIENT SPECIFIC FUNCTIONAL SCALE  Place score of 0-10 (0 = unable to perform activity and 10 = able to perform activity at the same level as before injury or problem)  Activity Date: 02/15/2024    Sleeping through the night  1    2. Driving  5    3. Pulling the cables off the truck  5    4.      Total Score 3.66      Total Score = Sum of activity scores/number of activities  Minimally Detectable Change: 3 points (for single activity); 2 points (for average score)  [facility] (nd). The Patient Specific Functional Scale .  Retrieved from [email or web address]   COGNITION: Overall cognitive status: Within functional limits for tasks assessed     SENSATION: Light touch: WFL and patient notes diminished sensation in Rt hand compared to Lt Intermittent n/t in arms  Patient also endorses n/t in Rt arm>Lt in ulnar type distribution, especially when leaning on extended arms while sitting.  POSTURE: Decreased cervical lordosis, forward head, rounded shoulders, increased thoracic kyphosis, bilat protracted scapula  CERVICAL ROM: 02/15/2024 Flexion: 50deg Extension: 30deg Rt lateral flexion: 30deg Lt lateral flexion: 27deg Rt rotation: 25% limited Lt rotation: 25% limited   UPPER EXTREMITY ROM:   Active ROM Right Eval 02/15/2024  Left Eval 02/15/2024  Shoulder flexion (seated) [facility]   Shoulder extension    Shoulder abduction (seated) 110deg 105deg  Shoulder adduction    Shoulder internal rotation (seated) L3  L1  Shoulder external rotation (seated) T2 T2  Elbow flexion    Elbow extension    Wrist flexion    Wrist extension    Wrist ulnar deviation    Wrist radial deviation    Wrist pronation    Wrist supination    (Blank rows = not tested)  UPPER EXTREMITY MMT:  MMT Right Eval 02/15/2024 Left Eval 02/15/2024  Shoulder flexion (seated) 3+, painful 4, painful  Shoulder extension    Shoulder abduction (seated) 3, painful 3, painful  Shoulder adduction    Shoulder internal rotation (seated) 4+ 4+  Shoulder external rotation (seated) 3, painful 3, painful  Middle trapezius    Lower trapezius    Elbow flexion    Elbow extension    Wrist flexion    Wrist extension    Wrist ulnar deviation    Wrist radial deviation    Wrist pronation    Wrist supination    Grip strength (lbs)    (Blank rows = not tested)  SHOULDER SPECIAL TESTS: Impingement tests: Hawkins/Kennedy impingement test: positive  Rotator cuff assessment: painful arc  positive   PALPATION:  TTP in bilat rhomboids/mid trap, upper trap, and levator scap                                                                                                                              TREATMENT DATE:  02/15/2024 TherEx:  HEP handout provided with patient performing one set of each activity for appropriate form. Verbal and tactile cues provided.  PT provided patient with yellow TB and tennis ball. PT discussed STM with tennis ball and dry needling with patient. Dry needling handout provided with written information about not being covered by insurance.  Self-Care:  POC, how the neck can interact with the shoulders, how n/t can be caused by cervical spine and/or tight musculature   PATIENT EDUCATION: Education details: HEP, STM, dry needling, POC Person educated: Patient Education method: Explanation, Demonstration, Tactile cues, Verbal cues, and Handouts Education comprehension: verbalized understanding, returned demonstration, verbal cues required, tactile cues required, and needs further education  HOME EXERCISE PROGRAM: Access Code: [email or web address] URL: [email or web address] Date: 02/15/2024 Prepared by: Susannah Daring  Exercises - Seated Scapular Retraction  - 1 x daily - 7 x weekly - 2 sets - 10 reps - Shoulder External Rotation and Scapular Retraction with Resistance  - 1 x daily - 7 x weekly - 2 sets - 10 reps - Seated Upper Trapezius Stretch  - 1 x daily - 7 x weekly - 3 sets - 30sec hold - Gentle Levator Scapulae Stretch  - 1 x daily - 7 x weekly - 3 sets - 30sec hold  ASSESSMENT:  CLINICAL IMPRESSION: Patient is a 58 y.o. M who was seen  today for physical therapy evaluation and treatment for chronic bilat shoulder pain and cervicalgia presenting with strength deficits, functional mobility deficits, postural deficits, radiculopathy, motor coordination deficits, and pain with movement. Patient is mainly limited secondary to pain levels with  functional mobility. Patient will benefit from skilled PT to address above noted deficits.    OBJECTIVE IMPAIRMENTS: decreased activity tolerance, decreased coordination, decreased ROM, decreased strength, increased muscle spasms, impaired sensation, improper body mechanics, postural dysfunction, and pain.   ACTIVITY LIMITATIONS: carrying, lifting, sleeping, bed mobility, reach over head, and pushing/pulling activities   PARTICIPATION LIMITATIONS: driving, community activity, and occupation  PERSONAL FACTORS: Past/current experiences, Profession, Time since onset of injury/illness/exacerbation, and 3+ comorbidities: GERD, arthritis, umbilical hernia, bilat inguinal hernia are also affecting patient's functional outcome.   REHAB POTENTIAL: Good  CLINICAL DECISION MAKING: Evolving/moderate complexity  EVALUATION COMPLEXITY: Moderate  GOALS: Goals reviewed with patient? Yes  SHORT TERM GOALS: Target date: 03/14/2024  Patient will show compliance with initial HEP.  Baseline: Goal status: INITIAL  2.  Patient will report pain levels no greater than 7/10 in order to show an improved overall quality of life. Baseline:  Goal status: INITIAL   LONG TERM GOALS: Target date: 04/11/2024  Patient will be independent with final HEP in order to maintain and progress upon functional gains made within PT. Baseline:  Goal status: INITIAL  2.  Patient will report pain levels no greater than 5/10 in order to show an improved overall quality of life. Baseline:  Goal status: INITIAL  3.  Patient will increase PSFS to at least 5.66 in order to show a significant improvement in subjective disability rating. Baseline:  Goal status: INITIAL  4.  Patient will increase bilat shoulder abduction ROM to at least 120deg in order to improve functional mobility. Baseline:  Goal status: INITIAL  5.  Patient will be able to tolerate UE MMT assessment with a 50% decrease in subjective pain levels.   Baseline:  Goal status: INITIAL  6.  Patient will report improvement in pain with occupation by at least 50% to show an overall improved quality of life. Baseline:  Goal status: INITIAL  PLAN: PT FREQUENCY: 1-2x/week  PT DURATION: 8 weeks  PLANNED INTERVENTIONS: [id number]- PT Re-evaluation, 97750- Physical Performance Testing, 97110-Therapeutic exercises, 97530- Therapeutic activity, [id number]- Neuromuscular re-education, 97535- Self Care, [id number]- Manual therapy, [id number]- Gait training, [id number]- Orthotic Initial, [phone]- Orthotic/Prosthetic subsequent, [phone]- Canalith repositioning, [phone]- Aquatic Therapy, [phone]- Electrical stimulation (unattended), [phone]- Electrical stimulation (manual), [id number]- Vasopneumatic device, [id number]- Ultrasound, [id number]- Traction (mechanical), [id number]- Ionotophoresis 4mg /ml Dexamethasone , 79439 (1-2 muscles), [id number] (3+ muscles)- Dry Needling, Patient/Family education, Balance training, Stair training, Taping, Joint mobilization, Joint manipulation, Spinal manipulation, Spinal mobilization, Vestibular training, DME instructions, Cryotherapy, and Moist heat  PLAN FOR NEXT SESSION: review HEP, postural strengthening, neural assessment (focus on ulnar), UE strengthening and mobility    Susannah Daring, PT, DPT 02/15/2024 10:46 AM

## 2024-02-15 ENCOUNTER — Ambulatory Visit

## 2024-02-15 DIAGNOSIS — M25512 Pain in left shoulder: Secondary | ICD-10-CM

## 2024-02-15 DIAGNOSIS — M6281 Muscle weakness (generalized): Secondary | ICD-10-CM

## 2024-02-15 DIAGNOSIS — M25611 Stiffness of right shoulder, not elsewhere classified: Secondary | ICD-10-CM | POA: Diagnosis not present

## 2024-02-15 DIAGNOSIS — M542 Cervicalgia: Secondary | ICD-10-CM | POA: Diagnosis not present

## 2024-02-15 DIAGNOSIS — G8929 Other chronic pain: Secondary | ICD-10-CM | POA: Diagnosis not present

## 2024-02-15 DIAGNOSIS — M25511 Pain in right shoulder: Secondary | ICD-10-CM | POA: Diagnosis not present

## 2024-02-15 DIAGNOSIS — M25612 Stiffness of left shoulder, not elsewhere classified: Secondary | ICD-10-CM

## 2024-03-04 ENCOUNTER — Encounter

## 2024-03-13 NOTE — Therapy (Signed)
 " OUTPATIENT PHYSICAL THERAPY UPPER EXTREMITY TREATMENT   Patient Name: Peter Archer MRN: [id number] DOB:03/23/1965, 59 y.o., male Today's Date: 03/14/2024  END OF SESSION:  PT End of Session - 03/14/24 0751     Visit Number 2    Number of Visits 16    Date for Recertification  04/11/24    Authorization Type [employer], 30% COINSURANCE 60 VL    Authorization - Visit Number 2    Authorization - Number of Visits 60    PT Start Time 0801    PT Stop Time 0841    PT Time Calculation (min) 40 min    Activity Tolerance Patient tolerated treatment well;Patient limited by pain    Behavior During Therapy [facility] for tasks assessed/performed           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   COLONOSCOPY  04/2019   INGUINAL HERNIA REPAIR Bilateral 11/27/2019   Procedure: LAPAROSCOPIC INGUINAL HERNIA WITH MESH;  Surgeon: Kinsinger, Herlene Righter, MD;  Location: [facility] [place];  Service: General;  Laterality: Bilateral;   UMBILICAL HERNIA REPAIR N/A 11/27/2019   Procedure: OPEN HERNIA REPAIR UMBILICAL WITH MESH;  Surgeon: Kinsinger, Herlene Righter, MD;  Location: [facility] ;  Service: General;  Laterality: N/A;   Patient Active Problem List   Diagnosis Date Noted   Prostatitis 04/09/2023   Elevated PSA 04/09/2023   Epistaxis not due to trauma 03/19/2023   Frequent urination 03/19/2023   Moderate mixed hyperlipidemia not requiring  statin therapy 03/19/2023   Class 1 obesity due to excess calories without serious comorbidity with body mass index (BMI) of 33.0 to 33.9 in adult 03/19/2023   Encounter for well adult exam without abnormal findings 03/19/2023   Postprandial RUQ pain 12/25/2022   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   GERD (gastroesophageal reflux disease) 04/02/2018   Seasonal allergies 04/02/2018   Cough present for greater than 3 weeks 04/02/2018   Closed nondisplaced fracture of distal phalanx of left middle finger 08/28/2017    PCP: Righter Hummer, MD  REFERRING PROVIDER: Bertrum Gaskins, PA-C  REFERRING DIAG: M25.511,G89.29,M25.512 (ICD-10-CM) - Chronic pain of both shoulders M54.2 (ICD-10-CM) - Cervicalgia  THERAPY DIAG:  Cervicalgia  Chronic pain of both shoulders  Stiffness of right shoulder, not elsewhere classified  Stiffness of left shoulder, not elsewhere classified  Muscle weakness (generalized)  Rationale for Evaluation and Treatment: Rehabilitation  ONSET DATE: years   SUBJECTIVE:  SUBJECTIVE STATEMENT: Patient arrived noting overall improvement in neck/back pain, however, is now having an increase in pain in bilat shoulders (Rt>Lt).  Hand dominance: Right  PERTINENT HISTORY: Patient reports that he did not have an event that led to his pain/soreness in neck and bilat shoulders. He had one fall many years ago where he landed on the floor, but was cleared by medical personnel. Patient has not had any surgeries, trauma, or other injuries to the area and surrounding joints. Patient received one cortisone shot in one shoulder (unsure of which one) many years ago that assisted with his pain, but hasn't had one since.   See PMH or personal factors for in depth comorbidities   PAIN:  Are you having pain? Yes: NPRS  scale: did not rate this session  Pain location: upper shoulder and neck (starts at bottom of shoulder blades and goes up) Pain description: sharp, shooting, aching, dull  Aggravating factors: sleeping, lying on shoulder, pulling cables for work  Relieving factors: tylenol , celebrex    PRECAUTIONS: None  RED FLAGS: Cervical red flags: Dysphagia No, Dysmetria No, Diplopia No, Nystagmus No, and Nausea No and Bowel or bladder incontinence: No   WEIGHT BEARING RESTRICTIONS: No  FALLS:  Has patient fallen in last 6 months? No  LIVING ENVIRONMENT: Lives with: lives with their spouse Lives in: House/apartment Stairs: Yes: Internal: 15 steps; on left going up Has following equipment at home: None  OCCUPATION: Truck driver   PLOF: Independent  PATIENT GOALS: make me better   NEXT MD VISIT: 03/17/2024 with Bertrum Gaskins, PA-C   OBJECTIVE:  Note: Objective measures were completed at Evaluation unless otherwise noted.  DIAGNOSTIC FINDINGS:  Cervical spine 2 views: No acute fracture.  Slight retrospondylolisthesis  C3 on C4.  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  Multilevel  anterior vertebral osteophytes present.  No other acute findings.  Slight  loss of lordosis.   X-rays of the right shoulder showed no acute abnormalities.  There is  age-appropriate minor degenerative changes of the [facility] joint.   X-rays of the left shoulder show no acute abnormalities.  There are some  mild age-appropriate minor degenerative changes of the [facility] joint.   PATIENT SURVEYS :  PSFS: THE PATIENT SPECIFIC FUNCTIONAL SCALE  Place score of 0-10 (0 = unable to perform activity and 10 = able to perform activity at the same level as before injury or problem)  Activity Date: 02/15/2024    Sleeping through the night  1    2. Driving  5    3. Pulling the cables off the truck  5    4.      Total Score 3.66      Total Score = Sum of activity scores/number of activities  Minimally Detectable  Change: 3 points (for single activity); 2 points (for average score)  [facility] (nd). The Patient Specific Functional Scale . Retrieved from [email or web address]   COGNITION: Overall cognitive status: Within functional limits for tasks assessed     SENSATION: Light touch: WFL and patient notes diminished sensation in Rt hand compared to Lt Intermittent n/t in arms  Patient also endorses n/t in Rt arm>Lt in ulnar type distribution, especially when leaning on extended arms while sitting.  POSTURE: Decreased cervical lordosis, forward head, rounded shoulders, increased thoracic kyphosis, bilat protracted scapula  CERVICAL ROM: 02/15/2024 Flexion: 50deg Extension: 30deg Rt lateral flexion: 30deg Lt lateral flexion: 27deg Rt rotation: 25% limited Lt rotation: 25% limited   UPPER EXTREMITY  ROM:   Active ROM Right Eval 02/15/2024 Left Eval 02/15/2024 Rt  03/14/2024 Lt 03/14/2024  Shoulder flexion (seated) [facility] WFL  WFL, painful at top [facility]  Shoulder extension      Shoulder abduction (seated) 110deg 105deg WFL, painful arc WFL   Shoulder adduction      Shoulder internal rotation (seated) L3  L1    Shoulder external rotation (seated) T2 T2    Elbow flexion      Elbow extension      Wrist flexion      Wrist extension      Wrist ulnar deviation      Wrist radial deviation      Wrist pronation      Wrist supination      (Blank rows = not tested)  UPPER EXTREMITY MMT:  MMT Right Eval 02/15/2024 Left Eval 02/15/2024  Shoulder flexion (seated) 3+, painful 4, painful  Shoulder extension    Shoulder abduction (seated) 3, painful 3, painful  Shoulder adduction    Shoulder internal rotation (seated) 4+ 4+  Shoulder external rotation (seated) 3, painful 3, painful  Middle trapezius    Lower trapezius    Elbow flexion    Elbow extension    Wrist flexion    Wrist extension    Wrist ulnar deviation    Wrist  radial deviation    Wrist pronation    Wrist supination    Grip strength (lbs)    (Blank rows = not tested)  SHOULDER SPECIAL TESTS: Impingement tests: Hawkins/Kennedy impingement test: positive  Rotator cuff assessment: painful arc positive   PALPATION:  TTP in bilat rhomboids/mid trap, upper trap, and levator scap                                                                                                                              TREATMENT DATE:  03/14/2024 TherEx:  ROM measured with results noted above  UBE with bilat UE only, no resistance, 3 min fwd/back  Supine horizontal adduction with red TB 2x10  Seated UT stretch 2x30s each direction   Neuro Re-Ed:  Standing rows with blue TB 2x15  Standing straight arm pulldowns with green TB 2x10   Self-Care:  PT discussed POC, potential reasons for shoulder pain increasing following decrease in neck pain, what to discuss during follow up with referring provider, how posture can impact shoulder pain  02/15/2024 TherEx:  HEP handout provided with patient performing one set of each activity for appropriate form. Verbal and tactile cues provided.  PT provided patient with yellow TB and tennis ball. PT discussed STM with tennis ball and dry needling with patient. Dry needling handout provided with written information about not being covered by insurance.  Self-Care:  POC, how the neck can interact with the shoulders, how n/t can be caused by cervical spine and/or tight musculature   PATIENT EDUCATION: Education details: HEP, STM, dry needling, POC Person educated: Patient Education method: Explanation, Demonstration,  Tactile cues, Verbal cues, and Handouts Education comprehension: verbalized understanding, returned demonstration, verbal cues required, tactile cues required, and needs further education  HOME EXERCISE PROGRAM: Access Code: [email or web address] URL: [email or web address] Date: 02/15/2024 Prepared by:  Susannah Daring  Exercises - Seated Scapular Retraction  - 1 x daily - 7 x weekly - 2 sets - 10 reps - Shoulder External Rotation and Scapular Retraction with Resistance  - 1 x daily - 7 x weekly - 2 sets - 10 reps - Seated Upper Trapezius Stretch  - 1 x daily - 7 x weekly - 3 sets - 30sec hold - Gentle Levator Scapulae Stretch  - 1 x daily - 7 x weekly - 3 sets - 30sec hold  ASSESSMENT:  CLINICAL IMPRESSION: Patient arrived to session endorsing improvement in back/neck pain with HEP compliance, however, is now having an increase in Rt shoulder pain. Patient tolerated all activities this date with no increase in pain. Patient will continue to benefit from skilled PT.   OBJECTIVE IMPAIRMENTS: decreased activity tolerance, decreased coordination, decreased ROM, decreased strength, increased muscle spasms, impaired sensation, improper body mechanics, postural dysfunction, and pain.   ACTIVITY LIMITATIONS: carrying, lifting, sleeping, bed mobility, reach over head, and pushing/pulling activities   PARTICIPATION LIMITATIONS: driving, community activity, and occupation  PERSONAL FACTORS: Past/current experiences, Profession, Time since onset of injury/illness/exacerbation, and 3+ comorbidities: GERD, arthritis, umbilical hernia, bilat inguinal hernia are also affecting patient's functional outcome.   REHAB POTENTIAL: Good  CLINICAL DECISION MAKING: Evolving/moderate complexity  EVALUATION COMPLEXITY: Moderate  GOALS: Goals reviewed with patient? Yes  SHORT TERM GOALS: Target date: 03/14/2024  Patient will show compliance with initial HEP.  Baseline: Goal status: INITIAL  2.  Patient will report pain levels no greater than 7/10 in order to show an improved overall quality of life. Baseline:  Goal status: INITIAL   LONG TERM GOALS: Target date: 04/11/2024  Patient will be independent with final HEP in order to maintain and progress upon functional gains made within PT. Baseline:  Goal  status: INITIAL  2.  Patient will report pain levels no greater than 5/10 in order to show an improved overall quality of life. Baseline:  Goal status: INITIAL  3.  Patient will increase PSFS to at least 5.66 in order to show a significant improvement in subjective disability rating. Baseline:  Goal status: INITIAL  4.  Patient will increase bilat shoulder abduction ROM to at least 120deg in order to improve functional mobility. Baseline:  Goal status: INITIAL  5.  Patient will be able to tolerate UE MMT assessment with a 50% decrease in subjective pain levels.  Baseline:  Goal status: INITIAL  6.  Patient will report improvement in pain with occupation by at least 50% to show an overall improved quality of life. Baseline:  Goal status: INITIAL  PLAN: PT FREQUENCY: 1-2x/week  PT DURATION: 8 weeks  PLANNED INTERVENTIONS: [id number]- PT Re-evaluation, 97750- Physical Performance Testing, 97110-Therapeutic exercises, 97530- Therapeutic activity, [id number]- Neuromuscular re-education, 97535- Self Care, [id number]- Manual therapy, [id number]- Gait training, [phone]- Orthotic Initial, [id number]- Orthotic/Prosthetic subsequent, [phone]- Canalith repositioning, [phone]- Aquatic Therapy, [phone]- Electrical stimulation (unattended), [phone]- Electrical stimulation (manual), [id number]- Vasopneumatic device, [id number]- Ultrasound, [id number]- Traction (mechanical), [id number]- Ionotophoresis 4mg /ml Dexamethasone , 79439 (1-2 muscles), [id number] (3+ muscles)- Dry Needling, Patient/Family education, Balance training, Stair training, Taping, Joint mobilization, Joint manipulation, Spinal manipulation, Spinal mobilization, Vestibular training, DME instructions, Cryotherapy, and Moist heat  PLAN FOR NEXT SESSION: update HEP, postural strengthening, neural assessment (focus on ulnar), UE strengthening and mobility  Susannah Daring, PT, DPT 03/14/2024 12:44 PM    "

## 2024-03-14 ENCOUNTER — Ambulatory Visit

## 2024-03-14 DIAGNOSIS — M25612 Stiffness of left shoulder, not elsewhere classified: Secondary | ICD-10-CM

## 2024-03-14 DIAGNOSIS — M6281 Muscle weakness (generalized): Secondary | ICD-10-CM | POA: Diagnosis not present

## 2024-03-14 DIAGNOSIS — M25512 Pain in left shoulder: Secondary | ICD-10-CM | POA: Diagnosis not present

## 2024-03-14 DIAGNOSIS — M25511 Pain in right shoulder: Secondary | ICD-10-CM

## 2024-03-14 DIAGNOSIS — M542 Cervicalgia: Secondary | ICD-10-CM

## 2024-03-14 DIAGNOSIS — M25611 Stiffness of right shoulder, not elsewhere classified: Secondary | ICD-10-CM | POA: Diagnosis not present

## 2024-03-14 DIAGNOSIS — G8929 Other chronic pain: Secondary | ICD-10-CM | POA: Diagnosis not present

## 2024-03-17 ENCOUNTER — Ambulatory Visit: Admitting: Physician Assistant

## 2024-03-17 ENCOUNTER — Encounter: Payer: Self-pay | Admitting: Physician Assistant

## 2024-03-17 DIAGNOSIS — M542 Cervicalgia: Secondary | ICD-10-CM

## 2024-03-17 DIAGNOSIS — M25512 Pain in left shoulder: Secondary | ICD-10-CM | POA: Diagnosis not present

## 2024-03-17 DIAGNOSIS — G8929 Other chronic pain: Secondary | ICD-10-CM | POA: Diagnosis not present

## 2024-03-17 DIAGNOSIS — M25511 Pain in right shoulder: Secondary | ICD-10-CM

## 2024-03-17 MED ORDER — METHYLPREDNISOLONE ACETATE 40 MG/ML IJ SUSP
40.0000 mg | INTRAMUSCULAR | Status: AC | PRN
  Administered 2024-03-17: 40 mg via INTRA_ARTICULAR

## 2024-03-17 MED ORDER — LIDOCAINE HCL 1 % IJ SOLN
3.0000 mL | INTRAMUSCULAR | Status: AC | PRN
  Administered 2024-03-17: 3 mL

## 2024-03-17 NOTE — Progress Notes (Signed)
 HPI: [name] returns today follow-up of his neck and bilateral shoulder pain.  He has been going to therapy and feels that this has helped quite a lot with his shoulder neck pain.  But he continues to have predominantly right shoulder pain.  He describes the pain in both shoulders to be in the deltoid region.  Pain on the right is 6 out of 10 at its worst.  Denies any radicular symptoms down either arm at this point in time.  Unable to sleep on the right shoulder due to pain.  States he is having minimal neck pain at this point in time since changing his pillow and going to therapy.  He continues Celebrex  Flexeril  and Tylenol .  Review of systems: See HPI otherwise negative  Physical exam: General well-developed well-nourished male no acute distress. Cervical spine full flexion extension without discomfort.  Nontender to palpation over the cervical spinal column.  Spurling's is negative. Bilateral shoulders: 5 out of 5 strength with external and internal rotation against resistance.  Empty can test is uncomfortable but no weakness.  Liftoff test negative bilaterally.  Abduction causes discomfort anterior bilateral shoulders minimal tenderness over the AC joints bilaterally.  Impingement testing positive on the right only.  Tenderness right shoulder medial border of the scapula.  Impression: Bilateral shoulder pain  Plan: Will have him continue therapy for his neck but of her shoulders.  Offered cortisone injection right shoulder today for both diagnostic and hopefully therapeutic purposes.  I have him follow-up with us  in 6 weeks to see how he is doing overall.  If he develops any radicular symptoms in the interim he will let us  know.  If he is doing well he can cancel the appointment.  Questions were encouraged and answered at length.    Procedure Note  Patient: Peter Archer             Date of Birth: Jul 10, 1965           MRN: [id number]             Visit Date:  03/17/2024  Procedures: Visit Diagnoses: No diagnosis found.  Large Joint Inj: R subacromial bursa on 03/17/2024 11:47 AM Indications: pain Details: 22 G 1.5 in needle, superior approach  Arthrogram: No  Medications: 3 mL lidocaine  1 %; 40 mg methylPREDNISolone  acetate 40 MG/ML Outcome: tolerated well, no immediate complications Procedure, treatment alternatives, risks and benefits explained, specific risks discussed. Consent was given by the patient. Immediately prior to procedure a time out was called to verify the correct patient, procedure, equipment, support staff and site/side marked as required. Patient was prepped and draped in the usual sterile fashion.

## 2024-03-20 ENCOUNTER — Encounter: Payer: Managed Care, Other (non HMO) | Admitting: Family Medicine

## 2024-03-20 NOTE — Therapy (Incomplete)
 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   COLONOSCOPY  04/2019   INGUINAL HERNIA REPAIR Bilateral 11/27/2019   Procedure: LAPAROSCOPIC INGUINAL HERNIA WITH MESH;  Surgeon: Kinsinger, Herlene Righter, MD;  Location: [facility] [place];  Service: General;  Laterality: Bilateral;   UMBILICAL HERNIA REPAIR N/A 11/27/2019   Procedure: OPEN HERNIA REPAIR UMBILICAL WITH MESH;  Surgeon: Kinsinger, Herlene Righter, MD;  Location: [facility] ;  Service: General;  Laterality: N/A;   Patient Active Problem List   Diagnosis Date Noted   Prostatitis 04/09/2023   Elevated PSA 04/09/2023   Epistaxis not due to trauma 03/19/2023   Frequent urination 03/19/2023   Moderate mixed hyperlipidemia not requiring statin therapy 03/19/2023   Class 1 obesity due to excess calories without serious comorbidity with body mass index (BMI) of 33.0 to 33.9 in adult 03/19/2023   Encounter for well adult exam without abnormal findings 03/19/2023   Postprandial RUQ pain 12/25/2022   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  Rationale for Evaluation and Treatment: Rehabilitation  ONSET DATE: years   SUBJECTIVE:                                                                                                                                                                                      SUBJECTIVE STATEMENT: *** Patient arrived noting overall improvement in neck/back pain, however, is now having an increase in pain in bilat shoulders (Rt>Lt).  Hand dominance:  Right  PERTINENT HISTORY: Patient reports that he did not have an event that led to his pain/soreness in neck and bilat shoulders. He had one fall many years ago where he landed on the floor, but was cleared by medical personnel. Patient has not had any surgeries, trauma, or other injuries to the area and surrounding joints. Patient received one cortisone shot in one shoulder (unsure of which one) many years ago that assisted with his pain, but hasn't had one since.   See PMH or personal factors for in depth comorbidities   PAIN:  Are you having pain? Yes: NPRS scale: did not rate this session  Pain location: upper shoulder and neck (starts at bottom of shoulder blades and goes up) Pain description: sharp, shooting, aching, dull  Aggravating factors: sleeping, lying on shoulder, pulling cables for work  Relieving factors: tylenol , celebrex    PRECAUTIONS: None  RED FLAGS: Cervical red flags: Dysphagia No, Dysmetria No, Diplopia No, Nystagmus No, and Nausea No and Bowel or bladder incontinence: No   WEIGHT BEARING RESTRICTIONS: No  FALLS:  Has patient fallen in last 6 months? No  LIVING ENVIRONMENT: Lives with: lives with their spouse Lives in: House/apartment Stairs: Yes: Internal:  15 steps; on left going up Has following equipment at home: None  OCCUPATION: Truck driver   PLOF: Independent  PATIENT GOALS: make me better   NEXT MD VISIT: 03/17/2024 with Bertrum Gaskins, PA-C   OBJECTIVE:  Note: Objective measures were completed at Evaluation unless otherwise noted.  DIAGNOSTIC FINDINGS:  Cervical spine 2 views: No acute fracture.  Slight retrospondylolisthesis  C3 on C4.  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  Multilevel  anterior vertebral osteophytes present.  No other acute findings.  Slight  loss of lordosis.   X-rays of the right shoulder showed no acute abnormalities.  There is  age-appropriate minor degenerative changes of the [facility] joint.   X-rays of the left shoulder show no acute abnormalities.  There are some  mild age-appropriate minor degenerative changes of the [facility] joint.   PATIENT SURVEYS :  PSFS: THE PATIENT SPECIFIC FUNCTIONAL SCALE  Place score of 0-10 (0 = unable to perform activity and 10 = able to perform activity at the same level as before injury or problem)  Activity Date: 02/15/2024    Sleeping through the night  1    2. Driving  5    3. Pulling the cables off the truck  5    4.      Total Score 3.66      Total Score = Sum of activity scores/number of activities  Minimally Detectable Change: 3 points (for single activity); 2 points (for average score)  [facility] (nd). The Patient Specific Functional Scale . Retrieved from [email or web address]   COGNITION: Overall cognitive status: Within functional limits for tasks assessed     SENSATION: Light touch: WFL and patient notes diminished sensation in Rt hand compared to Lt Intermittent n/t in arms  Patient also endorses n/t in Rt arm>Lt in ulnar type distribution, especially when leaning on extended arms while sitting.  POSTURE: Decreased cervical lordosis, forward head, rounded shoulders, increased  thoracic kyphosis, bilat protracted scapula  CERVICAL ROM: 02/15/2024 Flexion: 50deg Extension: 30deg Rt lateral flexion: 30deg Lt lateral flexion: 27deg Rt rotation: 25% limited Lt rotation: 25% limited   UPPER EXTREMITY ROM:   Active ROM Right Eval 02/15/2024 Left Eval 02/15/2024 Rt  03/14/2024 Lt 03/14/2024  Shoulder flexion (seated) [facility] WFL  WFL, painful  at top [facility]  Shoulder extension      Shoulder abduction (seated) 110deg 105deg WFL, painful arc [facility]   Shoulder adduction      Shoulder internal rotation (seated) L3  L1    Shoulder external rotation (seated) T2 T2    Elbow flexion      Elbow extension      Wrist flexion      Wrist extension      Wrist ulnar deviation      Wrist radial deviation      Wrist pronation      Wrist supination      (Blank rows = not tested)  UPPER EXTREMITY MMT:  MMT Right Eval 02/15/2024 Left Eval 02/15/2024  Shoulder flexion (seated) 3+, painful 4, painful  Shoulder extension    Shoulder abduction (seated) 3, painful 3, painful  Shoulder adduction    Shoulder internal rotation (seated) 4+ 4+  Shoulder external rotation (seated) 3, painful 3, painful  Middle trapezius    Lower trapezius    Elbow flexion    Elbow extension    Wrist flexion    Wrist extension    Wrist ulnar deviation    Wrist radial deviation    Wrist pronation    Wrist supination    Grip strength (lbs)    (Blank rows = not tested)  SHOULDER SPECIAL TESTS: Impingement tests: Hawkins/Kennedy impingement test: positive  Rotator cuff assessment: painful arc positive   PALPATION:  TTP in bilat rhomboids/mid trap, upper trap, and levator scap                                                                                                                              TREATMENT DATE:  03/21/2024 ***  03/14/2024 TherEx:  ROM measured with results noted above  UBE with bilat UE only, no resistance, 3 min fwd/back  Supine horizontal adduction with red TB 2x10   Seated UT stretch 2x30s each direction   Neuro Re-Ed:  Standing rows with blue TB 2x15  Standing straight arm pulldowns with green TB 2x10   Self-Care:  PT discussed POC, potential reasons for shoulder pain increasing following decrease in neck pain, what to discuss during follow up with referring provider, how posture can impact shoulder pain  02/15/2024 TherEx:  HEP handout provided with patient performing one set of each activity for appropriate form. Verbal and tactile cues provided.  PT provided patient with yellow TB and tennis ball. PT discussed STM with tennis ball and dry needling with patient. Dry needling handout provided with written information about not being covered by insurance.  Self-Care:  POC, how the neck can interact with the shoulders, how n/t can be caused by cervical spine and/or tight musculature   PATIENT EDUCATION: Education details: HEP, STM, dry needling, POC Person educated: Patient Education method: Explanation, Demonstration, Tactile cues, Verbal cues, and Handouts Education comprehension: verbalized understanding, returned demonstration, verbal cues required, tactile cues required, and needs further education  HOME EXERCISE PROGRAM: Access Code: [email or web address] URL: [email or web address] Date: 02/15/2024 Prepared by: Susannah Daring  Exercises - Seated Scapular Retraction  - 1 x daily - 7 x weekly - 2 sets - 10 reps - Shoulder External Rotation and Scapular Retraction with Resistance  - 1 x daily - 7 x weekly - 2 sets - 10 reps - Seated Upper Trapezius Stretch  - 1 x daily - 7 x weekly - 3 sets - 30sec hold - Gentle Levator Scapulae Stretch  - 1 x daily - 7 x weekly - 3 sets - 30sec hold  ASSESSMENT:  CLINICAL IMPRESSION: Patient arrived to session ***. Patient will continue to benefit from skilled PT.   OBJECTIVE IMPAIRMENTS: decreased activity tolerance, decreased coordination, decreased ROM, decreased strength, increased muscle  spasms, impaired sensation, improper body mechanics, postural dysfunction, and pain.   ACTIVITY LIMITATIONS: carrying, lifting, sleeping, bed mobility, reach over head, and pushing/pulling activities   PARTICIPATION LIMITATIONS: driving, community activity, and occupation  PERSONAL FACTORS: Past/current experiences, Profession, Time since onset of injury/illness/exacerbation, and 3+ comorbidities: GERD, arthritis, umbilical hernia, bilat inguinal hernia are also affecting patient's functional outcome.   REHAB POTENTIAL: Good  CLINICAL DECISION MAKING: Evolving/moderate complexity  EVALUATION COMPLEXITY: Moderate  GOALS: Goals reviewed with patient? Yes  SHORT TERM GOALS: Target date: 03/14/2024  Patient will show compliance with initial HEP.  Baseline: Goal status: INITIAL  2.  Patient will report pain levels no greater than 7/10 in order to show an improved overall quality of life. Baseline:  Goal status: INITIAL   LONG TERM GOALS: Target date: 04/11/2024  Patient will be independent with final HEP in order to maintain and progress upon functional gains made within PT. Baseline:  Goal status: INITIAL  2.  Patient will report pain levels no greater than 5/10 in order to show an improved overall quality of life. Baseline:  Goal status: INITIAL  3.  Patient will increase PSFS to at least 5.66 in order to show a significant improvement in subjective disability rating. Baseline:  Goal status: INITIAL  4.  Patient will increase bilat shoulder abduction ROM to at least 120deg in order to improve functional mobility. Baseline:  Goal status: INITIAL  5.  Patient will be able to tolerate UE MMT assessment with a 50% decrease in subjective pain levels.  Baseline:  Goal status: INITIAL  6.  Patient will report improvement in pain with occupation by at least 50% to show an overall improved quality of life. Baseline:  Goal status: INITIAL  PLAN: PT FREQUENCY: 1-2x/week  PT  DURATION: 8 weeks  PLANNED INTERVENTIONS: [id number]- PT Re-evaluation, 97750- Physical Performance Testing, 97110-Therapeutic exercises, 97530- Therapeutic activity, [id number]- Neuromuscular re-education, 97535- Self Care, [id number]- Manual therapy, [id number]- Gait training, [phone]- Orthotic Initial, [phone]- Orthotic/Prosthetic subsequent, [phone]- Canalith repositioning, [phone]- Aquatic Therapy, [phone]- Electrical stimulation (unattended), [phone]- Electrical stimulation (manual), [id number]- Vasopneumatic device, [id number]- Ultrasound, [id number]- Traction (mechanical), [id number]- Ionotophoresis 4mg /ml Dexamethasone , [id number] (1-2 muscles), [id number] (3+ muscles)- Dry Needling, Patient/Family education, Balance training, Stair training, Taping, Joint mobilization, Joint manipulation, Spinal manipulation, Spinal mobilization, Vestibular training, DME instructions, Cryotherapy, and Moist heat  PLAN FOR NEXT SESSION: *** update HEP, postural strengthening, neural assessment (focus on ulnar), UE strengthening and mobility    Susannah Daring, PT, DPT 03/20/24 7:53 AM    "

## 2024-03-21 ENCOUNTER — Encounter

## 2024-03-28 ENCOUNTER — Encounter: Payer: Self-pay | Admitting: Radiology

## 2024-03-31 ENCOUNTER — Ambulatory Visit: Admitting: Physician Assistant

## 2024-04-16 ENCOUNTER — Ambulatory Visit: Admitting: Physician Assistant

## 2024-04-23 ENCOUNTER — Encounter: Payer: Self-pay | Admitting: Family Medicine

## 2024-06-11 ENCOUNTER — Ambulatory Visit: Admitting: Urology
